# Patient Record
Sex: Male | Born: 1970 | Race: Black or African American | Hispanic: No | Marital: Married | Smoking: Never smoker
Health system: Southern US, Community
[De-identification: ages and names within clinical notes are randomized; demographics above are authoritative.]

## PROBLEM LIST (undated history)

## (undated) DIAGNOSIS — Z789 Other specified health status: Secondary | ICD-10-CM

## (undated) HISTORY — PX: NO PAST SURGERIES: SHX2092

---

## 1998-03-02 ENCOUNTER — Encounter: Admission: RE | Admit: 1998-03-02 | Discharge: 1998-03-02 | Payer: Self-pay | Admitting: Sports Medicine

## 1999-03-05 ENCOUNTER — Encounter: Payer: Self-pay | Admitting: Emergency Medicine

## 1999-03-05 ENCOUNTER — Emergency Department (HOSPITAL_COMMUNITY): Admission: EM | Admit: 1999-03-05 | Discharge: 1999-03-05 | Payer: Self-pay | Admitting: Emergency Medicine

## 2004-11-01 ENCOUNTER — Ambulatory Visit: Payer: Self-pay | Admitting: Internal Medicine

## 2007-06-03 DIAGNOSIS — J309 Allergic rhinitis, unspecified: Secondary | ICD-10-CM | POA: Insufficient documentation

## 2007-06-03 DIAGNOSIS — R519 Headache, unspecified: Secondary | ICD-10-CM | POA: Insufficient documentation

## 2007-06-03 DIAGNOSIS — I10 Essential (primary) hypertension: Secondary | ICD-10-CM | POA: Insufficient documentation

## 2007-06-03 DIAGNOSIS — R51 Headache: Secondary | ICD-10-CM

## 2012-03-02 ENCOUNTER — Encounter (HOSPITAL_COMMUNITY): Payer: Self-pay | Admitting: Family Medicine

## 2012-03-02 ENCOUNTER — Emergency Department (HOSPITAL_COMMUNITY)
Admission: EM | Admit: 2012-03-02 | Discharge: 2012-03-03 | Disposition: A | Discharge status: Home / Self Care | Payer: Self-pay | Attending: Emergency Medicine | Admitting: Emergency Medicine

## 2012-03-02 ENCOUNTER — Emergency Department (HOSPITAL_COMMUNITY): Payer: Self-pay

## 2012-03-02 DIAGNOSIS — R509 Fever, unspecified: Secondary | ICD-10-CM | POA: Insufficient documentation

## 2012-03-02 DIAGNOSIS — H6120 Impacted cerumen, unspecified ear: Secondary | ICD-10-CM | POA: Insufficient documentation

## 2012-03-02 DIAGNOSIS — R51 Headache: Secondary | ICD-10-CM | POA: Insufficient documentation

## 2012-03-02 NOTE — ED Notes (Signed)
Patient states he started having a fever, headache and chills since yesterday. Took Aleve at 1300, Nyquil Cold & Flu at 1900.
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
## 2012-03-03 MED ORDER — ALBUTEROL SULFATE HFA 108 (90 BASE) MCG/ACT IN AERS
2.0000 | INHALATION_SPRAY | RESPIRATORY_TRACT | Status: DC | PRN
  Administered 2012-03-03: 2 via RESPIRATORY_TRACT
  Filled 2012-03-03: qty 6.7

## 2012-03-03 NOTE — ED Provider Notes (Signed)
Medical screening examination/treatment/procedure(s) were performed by non-physician practitioner and as supervising physician I was immediately available for consultation/collaboration.  Awa Bachicha T Paris Hohn, MD 03/03/12 1623 

## 2012-03-03 NOTE — ED Notes (Signed)
Pt discharged.  No distress noted.

## 2012-03-03 NOTE — Discharge Instructions (Signed)
Please use albuterol inhaler 2 puff every 4-6 hrs as needed for shortness of breath.  Continue with your over the counter medication for fever and pain.  Drink plenty of fluid.  Return if your fever or headache worsen, or if you have other concerns.    Headaches, Frequently Asked Questions MIGRAINE HEADACHES Q: What is migraine? What causes it? How can I treat it? A: Generally, migraine headaches begin as a dull ache. Then they develop into a constant, throbbing, and pulsating pain. You may experience pain at the temples. You may experience pain at the front or back of one or both sides of the head. The pain is usually accompanied by a combination of:  Nausea.   Vomiting.   Sensitivity to light and noise.  Some people (about 15%) experience an aura (see below) before an attack. The cause of migraine is believed to be chemical reactions in the brain. Treatment for migraine may include over-the-counter or prescription medications. It may also include self-help techniques. These include relaxation training and biofeedback.  Q: What is an aura? A: About 15% of people with migraine get an "aura". This is a sign of neurological symptoms that occur before a migraine headache. You may see wavy or jagged lines, dots, or flashing lights. You might experience tunnel vision or blind spots in one or both eyes. The aura can include visual or auditory hallucinations (something imagined). It may include disruptions in smell (such as strange odors), taste or touch. Other symptoms include:  Numbness.   A "pins and needles" sensation.   Difficulty in recalling or speaking the correct word.  These neurological events may last as long as 60 minutes. These symptoms will fade as the headache begins. Q: What is a trigger? A: Certain physical or environmental factors can lead to or "trigger" a migraine. These include:  Foods.   Hormonal changes.   Weather.   Stress.  It is important to remember that triggers  are different for everyone. To help prevent migraine attacks, you need to figure out which triggers affect you. Keep a headache diary. This is a good way to track triggers. The diary will help you talk to your healthcare professional about your condition. Q: Does weather affect migraines? A: Bright sunshine, hot, humid conditions, and drastic changes in barometric pressure may lead to, or "trigger," a migraine attack in some people. But studies have shown that weather does not act as a trigger for everyone with migraines. Q: What is the link between migraine and hormones? A: Hormones start and regulate many of your body's functions. Hormones keep your body in balance within a constantly changing environment. The levels of hormones in your body are unbalanced at times. Examples are during menstruation, pregnancy, or menopause. That can lead to a migraine attack. In fact, about three quarters of all women with migraine report that their attacks are related to the menstrual cycle.  Q: Is there an increased risk of stroke for migraine sufferers? A: The likelihood of a migraine attack causing a stroke is very remote. That is not to say that migraine sufferers cannot have a stroke associated with their migraines. In persons under age 63, the most common associated factor for stroke is migraine headache. But over the course of a person's normal life span, the occurrence of migraine headache may actually be associated with a reduced risk of dying from cerebrovascular disease due to stroke.  Q: What are acute medications for migraine? A: Acute medications are used to treat  the pain of the headache after it has started. Examples over-the-counter medications, NSAIDs, ergots, and triptans.  Q: What are the triptans? A: Triptans are the newest class of abortive medications. They are specifically targeted to treat migraine. Triptans are vasoconstrictors. They moderate some chemical reactions in the brain. The triptans  work on receptors in your brain. Triptans help to restore the balance of a neurotransmitter called serotonin. Fluctuations in levels of serotonin are thought to be a main cause of migraine.  Q: Are over-the-counter medications for migraine effective? A: Over-the-counter, or "OTC," medications may be effective in relieving mild to moderate pain and associated symptoms of migraine. But you should see your caregiver before beginning any treatment regimen for migraine.  Q: What are preventive medications for migraine? A: Preventive medications for migraine are sometimes referred to as "prophylactic" treatments. They are used to reduce the frequency, severity, and length of migraine attacks. Examples of preventive medications include antiepileptic medications, antidepressants, beta-blockers, calcium channel blockers, and NSAIDs (nonsteroidal anti-inflammatory drugs). Q: Why are anticonvulsants used to treat migraine? A: During the past few years, there has been an increased interest in antiepileptic drugs for the prevention of migraine. They are sometimes referred to as "anticonvulsants". Both epilepsy and migraine may be caused by similar reactions in the brain.  Q: Why are antidepressants used to treat migraine? A: Antidepressants are typically used to treat people with depression. They may reduce migraine frequency by regulating chemical levels, such as serotonin, in the brain.  Q: What alternative therapies are used to treat migraine? A: The term "alternative therapies" is often used to describe treatments considered outside the scope of conventional Western medicine. Examples of alternative therapy include acupuncture, acupressure, and yoga. Another common alternative treatment is herbal therapy. Some herbs are believed to relieve headache pain. Always discuss alternative therapies with your caregiver before proceeding. Some herbal products contain arsenic and other toxins. TENSION HEADACHES Q: What is a  tension-type headache? What causes it? How can I treat it? A: Tension-type headaches occur randomly. They are often the result of temporary stress, anxiety, fatigue, or anger. Symptoms include soreness in your temples, a tightening band-like sensation around your head (a "vice-like" ache). Symptoms can also include a pulling feeling, pressure sensations, and contracting head and neck muscles. The headache begins in your forehead, temples, or the back of your head and neck. Treatment for tension-type headache may include over-the-counter or prescription medications. Treatment may also include self-help techniques such as relaxation training and biofeedback. CLUSTER HEADACHES Q: What is a cluster headache? What causes it? How can I treat it? A: Cluster headache gets its name because the attacks come in groups. The pain arrives with little, if any, warning. It is usually on one side of the head. A tearing or bloodshot eye and a runny nose on the same side of the headache may also accompany the pain. Cluster headaches are believed to be caused by chemical reactions in the brain. They have been described as the most severe and intense of any headache type. Treatment for cluster headache includes prescription medication and oxygen. SINUS HEADACHES Q: What is a sinus headache? What causes it? How can I treat it? A: When a cavity in the bones of the face and skull (a sinus) becomes inflamed, the inflammation will cause localized pain. This condition is usually the result of an allergic reaction, a tumor, or an infection. If your headache is caused by a sinus blockage, such as an infection, you will probably have a  fever. An x-ray will confirm a sinus blockage. Your caregiver's treatment might include antibiotics for the infection, as well as antihistamines or decongestants.  REBOUND HEADACHES Q: What is a rebound headache? What causes it? How can I treat it? A: A pattern of taking acute headache medications too  often can lead to a condition known as "rebound headache." A pattern of taking too much headache medication includes taking it more than 2 days per week or in excessive amounts. That means more than the label or a caregiver advises. With rebound headaches, your medications not only stop relieving pain, they actually begin to cause headaches. Doctors treat rebound headache by tapering the medication that is being overused. Sometimes your caregiver will gradually substitute a different type of treatment or medication. Stopping may be a challenge. Regularly overusing a medication increases the potential for serious side effects. Consult a caregiver if you regularly use headache medications more than 2 days per week or more than the label advises. ADDITIONAL QUESTIONS AND ANSWERS Q: What is biofeedback? A: Biofeedback is a self-help treatment. Biofeedback uses special equipment to monitor your body's involuntary physical responses. Biofeedback monitors:  Breathing.   Pulse.   Heart rate.   Temperature.   Muscle tension.   Brain activity.  Biofeedback helps you refine and perfect your relaxation exercises. You learn to control the physical responses that are related to stress. Once the technique has been mastered, you do not need the equipment any more. Q: Are headaches hereditary? A: Four out of five (80%) of people that suffer report a family history of migraine. Scientists are not sure if this is genetic or a family predisposition. Despite the uncertainty, a child has a 50% chance of having migraine if one parent suffers. The child has a 75% chance if both parents suffer.  Q: Can children get headaches? A: By the time they reach high school, most young people have experienced some type of headache. Many safe and effective approaches or medications can prevent a headache from occurring or stop it after it has begun.  Q: What type of doctor should I see to diagnose and treat my headache? A: Start  with your primary caregiver. Discuss his or her experience and approach to headaches. Discuss methods of classification, diagnosis, and treatment. Your caregiver may decide to recommend you to a headache specialist, depending upon your symptoms or other physical conditions. Having diabetes, allergies, etc., may require a more comprehensive and inclusive approach to your headache. The National Head[employer] Foundation will provide, upon request, a list of [facility] physician members in your state. Document Released: 01/10/2004 Document Revised: 10/09/2011 Document Reviewed: 06/19/2008 [facility] Patient Information 2012 [place], [place].

## 2012-03-03 NOTE — ED Notes (Signed)
Pt. Became pale and fainted during blood draw for carboxyhemoglobin.  Assisted pt. Into chair and reclined it into trendelenburg.  Pt. Immediately regained color and felt better.  VSS.  Family at bedside.  PA at bedside to assess.

## 2012-03-03 NOTE — ED Notes (Signed)
Pt awake and alert with no complaints voiced

## 2012-11-16 ENCOUNTER — Emergency Department (HOSPITAL_COMMUNITY)
Admission: EM | Admit: 2012-11-16 | Discharge: 2012-11-16 | Disposition: A | Discharge status: Home / Self Care | Payer: Self-pay | Attending: Emergency Medicine | Admitting: Emergency Medicine

## 2012-11-16 DIAGNOSIS — R509 Fever, unspecified: Secondary | ICD-10-CM | POA: Insufficient documentation

## 2012-11-16 DIAGNOSIS — H00019 Hordeolum externum unspecified eye, unspecified eyelid: Secondary | ICD-10-CM | POA: Insufficient documentation

## 2012-11-16 MED ORDER — ERYTHROMYCIN 5 MG/GM OP OINT: TOPICAL_OINTMENT | OPHTHALMIC | Status: DC

## 2012-11-16 MED ORDER — ERYTHROMYCIN 5 MG/GM OP OINT
TOPICAL_OINTMENT | Freq: Once | OPHTHALMIC | Status: AC
  Administered 2012-11-16: 19:00:00 via OPHTHALMIC
  Filled 2012-11-16: qty 3.5

## 2012-11-16 NOTE — ED Provider Notes (Signed)
History  Scribed for Billy Gourd, PA-C/ Flint Melter, MD, the patient was seen in room WTR5/WTR5. This chart was scribed by Candelaria Stagers. The patient's care started at 6:48 PM    CSN: [id number]  Arrival date & time 11/16/12  1639   First MD Initiated Contact with Patient 11/16/12 1827      Chief Complaint  Patient presents with  . Eye Problem     The history is provided by the patient. No language interpreter was used.   Billy Colon is a 42 y.o. male who presents to the Emergency Department complaining of right eye intermittent swelling and scratchiness that started about three months ago.  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  He has applied warm compresses with some relief.  Pt has experienced fever, drainage from right eye, and occasional blurred vision.  He denies getting anything in the eye.  He denies photophobia.  Pt reports the sty began after he started taking cold showers due to issues with having hot water at his house.      No past medical history on file.  No past surgical history on file.  No family history on file.  History  Substance Use Topics  . Smoking status: Not on file  . Smokeless tobacco: Not on file  . Alcohol Use: Yes     Comment: Occasional      Review of Systems  Constitutional: Positive for fever. Negative for chills.  Eyes: Positive for discharge and visual disturbance (intermittent blurred vision). Negative for photophobia.       Right eye swelling.   Respiratory: Negative for shortness of breath.   Gastrointestinal: Negative for nausea and vomiting.  Neurological: Negative for weakness.  All other systems reviewed and are negative.    Allergies  Review of patient's allergies indicates no known allergies.  Home Medications  No current outpatient prescriptions on file.  BP 136/85  Pulse 64  Temp 98.4 F (36.9 C) (Oral)  Resp 15  SpO2 100%  Physical Exam  Nursing note and vitals  reviewed. Constitutional: He is oriented to person, place, and time. He appears well-developed and well-nourished. No distress.  HENT:  Head: Normocephalic and atraumatic.  Eyes: Conjunctivae normal and EOM are normal.       External hordeolum with erythema on the right eyelid.  No foreign bodies. Mild tenderness to palpation.    Neck: Neck supple. No tracheal deviation present.  Cardiovascular: Normal rate and regular rhythm.   Pulmonary/Chest: Effort normal and breath sounds normal. No respiratory distress. He has no wheezes. He has no rales.  Musculoskeletal: Normal range of motion.  Neurological: He is alert and oriented to person, place, and time.  Skin: Skin is warm and dry.  Psychiatric: He has a normal mood and affect. His behavior is normal.    ED Course  Procedures   DIAGNOSTIC STUDIES: Oxygen Saturation is 100% on room air, normal by my interpretation.    COORDINATION OF CARE:     Labs Reviewed - No data to display No results found.   1. External hordeolum       MDM  42 y/o male with external hordeolum. No improvement despite warm compresses. Rx erythromycin ointment. Advised him to continue warm compresses. He will f/u with optho if no improvement in 1 week. Return precautions discussed. Patient states understanding of plan and is agreeable.  I personally performed the services described in this documentation, which was scribed in my presence.  The recorded information has been reviewed and is accurate.         Trevor Mace, PA-C 11/17/12 0051  Trevor Mace, PA-C 11/17/12 [phone]

## 2012-11-16 NOTE — ED Notes (Signed)
Pt states he has had some swelling around his R eye for the past 3 months. Pt concerned it may be a sty. Pt states eye is irritated and scratchy and worse at night.

## 2012-11-17 NOTE — ED Provider Notes (Signed)
Medical screening examination/treatment/procedure(s) were performed by non-physician practitioner and as supervising physician I was immediately available for consultation/collaboration.   Molleigh Huot L Elmus Mathes, MD 11/17/12 2321 

## 2014-08-02 ENCOUNTER — Emergency Department (HOSPITAL_COMMUNITY): Payer: Self-pay

## 2014-08-02 ENCOUNTER — Inpatient Hospital Stay (HOSPITAL_COMMUNITY)
Admission: EM | Admit: 2014-08-02 | Discharge: 2014-08-07 | DRG: 373 | Disposition: A | Discharge status: Home / Self Care | Payer: Self-pay | Attending: General Surgery | Admitting: General Surgery

## 2014-08-02 ENCOUNTER — Encounter (HOSPITAL_COMMUNITY): Payer: Self-pay | Admitting: Emergency Medicine

## 2014-08-02 DIAGNOSIS — K352 Acute appendicitis with generalized peritonitis: Principal | ICD-10-CM | POA: Diagnosis present

## 2014-08-02 DIAGNOSIS — K37 Unspecified appendicitis: Secondary | ICD-10-CM | POA: Diagnosis present

## 2014-08-02 DIAGNOSIS — K3532 Acute appendicitis with perforation and localized peritonitis, without abscess: Secondary | ICD-10-CM | POA: Diagnosis present

## 2014-08-02 DIAGNOSIS — Z9109 Other allergy status, other than to drugs and biological substances: Secondary | ICD-10-CM

## 2014-08-02 DIAGNOSIS — K3580 Unspecified acute appendicitis: Secondary | ICD-10-CM

## 2014-08-02 HISTORY — DX: Other specified health status: Z78.9

## 2014-08-02 LAB — CBC WITH DIFFERENTIAL/PLATELET
Basophils Absolute: 0 10*3/uL (ref 0.0–0.1)
Basophils Relative: 0 % (ref 0–1)
Eosinophils Absolute: 0.1 10*3/uL (ref 0.0–0.7)
Eosinophils Relative: 1 % (ref 0–5)
HEMATOCRIT: 42.9 % (ref 39.0–52.0)
HEMOGLOBIN: 14.4 g/dL (ref 13.0–17.0)
LYMPHS PCT: 15 % (ref 12–46)
Lymphs Abs: 1.5 10*3/uL (ref 0.7–4.0)
MCH: 28.1 pg (ref 26.0–34.0)
MCHC: 33.6 g/dL (ref 30.0–36.0)
MCV: 83.6 fL (ref 78.0–100.0)
MONO ABS: 0.9 10*3/uL (ref 0.1–1.0)
MONOS PCT: 9 % (ref 3–12)
NEUTROS ABS: 7.8 10*3/uL — AB (ref 1.7–7.7)
NEUTROS PCT: 75 % (ref 43–77)
Platelets: 225 10*3/uL (ref 150–400)
RBC: 5.13 MIL/uL (ref 4.22–5.81)
RDW: 14.5 % (ref 11.5–15.5)
WBC: 10.3 10*3/uL (ref 4.0–10.5)

## 2014-08-02 LAB — COMPREHENSIVE METABOLIC PANEL
ALK PHOS: 125 U/L — AB (ref 39–117)
ALT: 25 U/L (ref 0–53)
ANION GAP: 11 (ref 5–15)
AST: 22 U/L (ref 0–37)
Albumin: 3.5 g/dL (ref 3.5–5.2)
BILIRUBIN TOTAL: 1 mg/dL (ref 0.3–1.2)
BUN: 15 mg/dL (ref 6–23)
CHLORIDE: 99 meq/L (ref 96–112)
CO2: 27 meq/L (ref 19–32)
CREATININE: 0.91 mg/dL (ref 0.50–1.35)
Calcium: 9 mg/dL (ref 8.4–10.5)
GFR calc Af Amer: >90 mL/min (ref >90)
Glucose, Bld: 103 mg/dL — ABNORMAL HIGH (ref 70–99)
POTASSIUM: 4.4 meq/L (ref 3.7–5.3)
Sodium: 137 mEq/L (ref 137–147)
Total Protein: 7.9 g/dL (ref 6.0–8.3)

## 2014-08-02 LAB — URINALYSIS, ROUTINE W REFLEX MICROSCOPIC
BILIRUBIN URINE: NEGATIVE
GLUCOSE, UA: NEGATIVE mg/dL
Hgb urine dipstick: NEGATIVE
KETONES UR: NEGATIVE mg/dL
LEUKOCYTES UA: NEGATIVE
Nitrite: NEGATIVE
PH: 6 (ref 5.0–8.0)
PROTEIN: 30 mg/dL — AB
Specific Gravity, Urine: 1.036 — ABNORMAL HIGH (ref 1.005–1.030)
Urobilinogen, UA: 1 mg/dL (ref 0.0–1.0)

## 2014-08-02 LAB — LIPASE, BLOOD: Lipase: 29 U/L (ref 11–59)

## 2014-08-02 LAB — URINE MICROSCOPIC-ADD ON: Urine-Other: NONE SEEN

## 2014-08-02 MED ORDER — ACETAMINOPHEN 325 MG PO TABS: 650.0000 mg | ORAL_TABLET | Freq: Four times a day (QID) | ORAL | Status: DC | PRN

## 2014-08-02 MED ORDER — IOHEXOL 300 MG/ML  SOLN
50.0000 mL | Freq: Once | INTRAMUSCULAR | Status: AC | PRN
  Administered 2014-08-02: 50 mL via ORAL

## 2014-08-02 MED ORDER — DEXTROSE 5 % IV SOLN
1.0000 g | Freq: Four times a day (QID) | INTRAVENOUS | Status: DC
  Administered 2014-08-02 – 2014-08-07 (×18): 1 g via INTRAVENOUS
  Filled 2014-08-02 (×19): qty 1

## 2014-08-02 MED ORDER — ENOXAPARIN SODIUM 40 MG/0.4ML ~~LOC~~ SOLN
40.0000 mg | Freq: Every day | SUBCUTANEOUS | Status: DC
  Administered 2014-08-02 – 2014-08-06 (×5): 40 mg via SUBCUTANEOUS
  Filled 2014-08-02 (×6): qty 0.4

## 2014-08-02 MED ORDER — KETOROLAC TROMETHAMINE 30 MG/ML IJ SOLN
30.0000 mg | Freq: Once | INTRAMUSCULAR | Status: AC
  Administered 2014-08-02: 30 mg via INTRAVENOUS
  Filled 2014-08-02: qty 1

## 2014-08-02 MED ORDER — HYDROCODONE-ACETAMINOPHEN 5-325 MG PO TABS
1.0000 | ORAL_TABLET | ORAL | Status: DC | PRN
  Administered 2014-08-03 (×2): 2 via ORAL
  Administered 2014-08-03: 1 via ORAL
  Administered 2014-08-03: 2 via ORAL
  Filled 2014-08-02: qty 1
  Filled 2014-08-02 (×3): qty 2

## 2014-08-02 MED ORDER — DOCUSATE SODIUM 100 MG PO CAPS
100.0000 mg | ORAL_CAPSULE | Freq: Two times a day (BID) | ORAL | Status: DC
  Administered 2014-08-02 – 2014-08-06 (×6): 100 mg via ORAL
  Filled 2014-08-02 (×11): qty 1

## 2014-08-02 MED ORDER — ACETAMINOPHEN 650 MG RE SUPP: 650.0000 mg | Freq: Four times a day (QID) | RECTAL | Status: DC | PRN

## 2014-08-02 MED ORDER — SODIUM CHLORIDE 0.9 % IV BOLUS (SEPSIS)
1000.0000 mL | INTRAVENOUS | Status: AC
  Administered 2014-08-02: 1000 mL via INTRAVENOUS

## 2014-08-02 MED ORDER — DIPHENHYDRAMINE HCL 12.5 MG/5ML PO ELIX: 12.5000 mg | ORAL_SOLUTION | Freq: Four times a day (QID) | ORAL | Status: DC | PRN

## 2014-08-02 MED ORDER — ONDANSETRON HCL 4 MG/2ML IJ SOLN
4.0000 mg | Freq: Four times a day (QID) | INTRAMUSCULAR | Status: DC | PRN
Start: 2014-08-02 — End: 2014-08-07
  Administered 2014-08-04: 4 mg via INTRAVENOUS
  Filled 2014-08-02: qty 2

## 2014-08-02 MED ORDER — KCL IN DEXTROSE-NACL 20-5-0.45 MEQ/L-%-% IV SOLN
INTRAVENOUS | Status: DC
  Administered 2014-08-03: 18:00:00 via INTRAVENOUS
  Administered 2014-08-03: 75 mL via INTRAVENOUS
  Administered 2014-08-04: 22:00:00 via INTRAVENOUS
  Administered 2014-08-04: 75 mL via INTRAVENOUS
  Administered 2014-08-05: 75 mL/h via INTRAVENOUS
  Administered 2014-08-06 – 2014-08-07 (×2): via INTRAVENOUS
  Filled 2014-08-02 (×8): qty 1000

## 2014-08-02 MED ORDER — IOHEXOL 300 MG/ML  SOLN
100.0000 mL | Freq: Once | INTRAMUSCULAR | Status: AC | PRN
  Administered 2014-08-02: 100 mL via INTRAVENOUS

## 2014-08-02 MED ORDER — DIPHENHYDRAMINE HCL 50 MG/ML IJ SOLN: 12.5000 mg | Freq: Four times a day (QID) | INTRAMUSCULAR | Status: DC | PRN

## 2014-08-02 NOTE — ED Provider Notes (Signed)
CSN: [id number]     Arrival date & time 08/02/14  1338 History   First MD Initiated Contact with Patient 08/02/14 1504     Chief Complaint  Patient presents with  . Abdominal Pain     (Consider location/radiation/quality/duration/timing/severity/associated sxs/prior Treatment) The history is provided by the patient and medical records. No language interpreter was used.    Billy Colon is a 43 y.o. male  with no known medical presents to the Emergency Department complaining of gradual, persistent, nagging generalized and lower abd pain with associated urnary frequency onset 5 days ago.  Associated symptoms include subjective fever for the first day which ha not reoccured.  Aleve makes it better and nothing makes it worse.  Pt denies chills, headache, neck pain,  chest pain,  SOB, N/V/D, weakness, dizziness, syncope, dysuria, hematuria, polyuria, polydipsia.       Past Medical History  Diagnosis Date  . Medical history non-contributory    Past Surgical History  Procedure Laterality Date  . No past surgeries     History reviewed. No pertinent family history. History  Substance Use Topics  . Smoking status: Never Smoker   . Smokeless tobacco: Never Used  . Alcohol Use: Yes     Comment: Occasional    Review of Systems  Constitutional: Negative for fever, diaphoresis, appetite change, fatigue and unexpected weight change.  HENT: Negative for mouth sores.   Eyes: Negative for visual disturbance.  Respiratory: Negative for cough, chest tightness, shortness of breath and wheezing.   Cardiovascular: Negative for chest pain.  Gastrointestinal: Positive for abdominal pain. Negative for nausea, vomiting, diarrhea and constipation.  Endocrine: Negative for polydipsia, polyphagia and polyuria.  Genitourinary: Negative for dysuria, urgency, frequency and hematuria.  Musculoskeletal: Negative for back pain and neck stiffness.  Skin: Negative for rash.  Allergic/Immunologic: Negative for  immunocompromised state.  Neurological: Negative for syncope, light-headedness and headaches.  Hematological: Does not bruise/bleed easily.  Psychiatric/Behavioral: Negative for sleep disturbance. The patient is not nervous/anxious.       Allergies  Pollen extract  Home Medications   Prior to Admission medications   Medication Sig Start Date End Date Taking? Authorizing Provider  naproxen sodium (ANAPROX) 220 MG tablet Take 220 mg by mouth 2 (two) times daily with a meal.   Yes Historical Provider, MD   BP 114/67  Pulse 62  Temp(Src) 98.4 F (36.9 C) (Oral)  Resp 16  SpO2 100% Physical Exam  Nursing note and vitals reviewed. Constitutional: He appears well-developed and well-nourished.  HENT:  Head: Normocephalic and atraumatic.  Mouth/Throat: Oropharynx is clear and moist.  Eyes: Conjunctivae are normal. No scleral icterus.  Cardiovascular: Normal rate, regular rhythm, normal heart sounds and intact distal pulses.   No murmur heard. Pulmonary/Chest: Effort normal and breath sounds normal. No respiratory distress. He has no wheezes.  Abdominal: Soft. Bowel sounds are normal. He exhibits no distension and no mass. There is tenderness in the right lower quadrant, suprapubic area and left lower quadrant. There is guarding. There is no rebound and no CVA tenderness.  Lower abd tenderness with guarding, but no rebound or peritoneal signs No CVA tenderness  Neurological: He is alert. He exhibits normal muscle tone. Coordination normal.  Skin: Skin is warm and dry. No erythema.  Psychiatric: He has a normal mood and affect.    ED Course  Procedures (including critical care time) Labs Review Labs Reviewed  CBC WITH DIFFERENTIAL - Abnormal; Notable for the following:    Neutro Abs 7.8 (*)  All other components within normal limits  COMPREHENSIVE METABOLIC PANEL - Abnormal; Notable for the following:    Glucose, Bld 103 (*)    Alkaline Phosphatase 125 (*)    All other  components within normal limits  URINALYSIS, ROUTINE W REFLEX MICROSCOPIC - Abnormal; Notable for the following:    Color, Urine AMBER (*)    Specific Gravity, Urine 1.036 (*)    Protein, ur 30 (*)    All other components within normal limits  LIPASE, BLOOD  URINE MICROSCOPIC-ADD ON    Imaging Review Ct Abdomen Pelvis W Contrast  08/02/2014   CLINICAL DATA:  Low abdominal pain  EXAM: CT ABDOMEN AND PELVIS WITH CONTRAST  TECHNIQUE: Multidetector CT imaging of the abdomen and pelvis was performed using the standard protocol following bolus administration of intravenous contrast.  CONTRAST:  50mL OMNIPAQUE IOHEXOL 300 MG/ML SOLN, OMNIPAQUE IOHEXOL 300 MG/ML SOLN  COMPARISON:  None.  FINDINGS: The lung bases are free of acute infiltrate or sizable effusion. No parenchymal nodules are seen.  The liver, gallbladder, spleen, adrenal glands and pancreas are all normal in their CT appearance. The kidneys demonstrate a normal enhancement pattern bilaterally. No renal calculi or obstructive changes are seen.  The appendix is well visualized and appears within normal limits centrally at its junction with the ascending colon. More peripherally there are calcifications within and the appendix terminates in the region of a multiloculated fluid collection which measures approximately 4.4 x 3.7 cm. Although this may simply represent an abnormally dilated distal appendix, the possibility of localized rupture of appendicitis with a small multiloculated abscess would deserve consideration. This is not amenable to percutaneous drainage as no clear window is noted.  The bladder is well distended. No free fluid is noted within the pelvis. The osseous structures show no acute abnormality.  IMPRESSION: Changes consistent with appendicitis either within abnormally dilated appendiceal tip or a localized rupture with contained multiloculated abscess. It is not amenable to percutaneous intervention due to its position deep  within this pelvis overlying the lumbosacral junction. Surgical consultation is recommended.   Electronically Signed   By: Alcide Clever M.D.   On: 08/02/2014 19:24     EKG Interpretation None      MDM   Final diagnoses:  Acute appendicitis, unspecified acute appendicitis type   Billy Colon presents with lower abd pain and pain with urination.  Will obtain labs and reassess.    5:00PM Pt with increasing abd pain and tenderness on exam; will obtain CT scan.  No leukocytosis, pt is afebrile without tachycardia.  Pt declines further pain control and remains NPO.   No evidence of UTI.   7:53 PM Pt with Changes consistent with appendicitis either within abnormally dilated appendiceal tip or a localized rupture with contained multiloculated abscess  8:39 PM Discussed with Dr. Maisie Fus who will evaluate and admit.    BP 114/67  Pulse 62  Temp(Src) 98.4 F (36.9 C) (Oral)  Resp 16  SpO2 100%   Dierdre Forth, PA-C 08/02/14 2040

## 2014-08-02 NOTE — ED Provider Notes (Signed)
Medical screening examination/treatment/procedure(s) were conducted as a shared visit with non-physician practitioner(s) and myself.  I personally evaluated the patient during the encounter.   EKG Interpretation None     Patient here with right lower quadrant pain. He abdominal exam is positive for right lower quadrant tenderness without peritoneal signs. Abdominal CT positive for appendicitis and surgery to be counseled  Billy BakerAnthony T Kharson Rasmusson, MD 08/02/14 [phone]

## 2014-08-02 NOTE — Progress Notes (Signed)
  CARE MANAGEMENT ED NOTE 08/02/2014  Patient:  Billy Colon,Billy Colon   Account Number:  [id number]  Date Initiated:  08/02/2014  Documentation initiated by:  Radford PaxFERRERO,Maison Agrusa  Subjective/Objective Assessment:   Patient presents to Ed with lower abdominal pain and urinary frequency     Subjective/Objective Assessment Detail:   CT of abdomen and pelvis: Changes consistent with appendicitis either within abnormally  dilated appendiceal tip or a localized rupture with contained  multiloculated abscess     Action/Plan:   Action/Plan Detail:   Anticipated DC Date:       Status Recommendation to Physician:   Result of Recommendation:    Other ED Services  Consult Working Plan    DC Planning Services  Other  PCP issues    Choice offered to / List presented to:            Status of service:  Completed, signed off  ED Comments:   ED Comments Detail:  EDCM spoke to patient and his wife at bedside.  Patient confirms he [place] a [place]cp [place]  [facility], informed patient of services there and walk in times. EDCM also provided patient with list of pcps who accept self pay patients, list of discounted pharmacies and websites needymeds.org and GoodRX.com for medication assistance, list of financial resources in the community such as local churches, salvation [employer], urban ministries, phonenumber to inqiure about the [employer] card, DSS for [employer] and phone number to inquire about [employer] Care Act for insurance, and dental assistance for uninsured patients.  Patient thankful for resources.  No further EDCM needs at this time.

## 2014-08-02 NOTE — ED Notes (Signed)
Pt c/o low abd pain w/ frequency.  Denies dysuria.  Denies NVD.

## 2014-08-02 NOTE — H&P (Signed)
Billy Colon is an 43 y.o. male.   Chief Complaint: abd pain HPI: Pt presents to the Emergency Department complaining of gradual, persistent, nagging generalized and lower abd pain with associated urnary frequency onset 5 days ago. Associated symptoms include subjective fever for the first day which ha not reoccured. His bowel habits have been regular.  Pt denies chills, headache, N/V/D.  Past Medical History  Diagnosis Date  . Medical history non-contributory     Past Surgical History  Procedure Laterality Date  . No past surgeries      History reviewed. No pertinent family history. Social History:  reports that he has never smoked. He has never used smokeless tobacco. He reports that he drinks alcohol. He reports that he does not use illicit drugs.  Allergies:  Allergies  Allergen Reactions  . Pollen Extract Other (See Comments)    Sneezing     (Not in a hospital admission)  Results for orders placed during the hospital encounter of 08/02/14 (from the past 48 hour(s))  URINALYSIS, ROUTINE W REFLEX MICROSCOPIC     Status: Abnormal   Collection Time    08/02/14  3:20 PM      Result Value Ref Range   Color, Urine AMBER (*) YELLOW   Comment: BIOCHEMICALS MAY BE AFFECTED BY COLOR   APPearance CLEAR  CLEAR   Specific Gravity, Urine 1.036 (*) 1.005 - 1.030   pH 6.0  5.0 - 8.0   Glucose, UA NEGATIVE  NEGATIVE mg/dL   Hgb urine dipstick NEGATIVE  NEGATIVE   Bilirubin Urine NEGATIVE  NEGATIVE   Ketones, ur NEGATIVE  NEGATIVE mg/dL   Protein, ur 30 (*) NEGATIVE mg/dL   Urobilinogen, UA 1.0  0.0 - 1.0 mg/dL   Nitrite NEGATIVE  NEGATIVE   Leukocytes, UA NEGATIVE  NEGATIVE  URINE MICROSCOPIC-ADD ON     Status: None   Collection Time    08/02/14  3:20 PM      Result Value Ref Range   Urine-Other       Value: NO FORMED ELEMENTS SEEN ON URINE MICROSCOPIC EXAMINATION  LIPASE, BLOOD     Status: None   Collection Time    08/02/14  3:51 PM      Result Value Ref Range   Lipase 29   11 - 59 U/L  CBC WITH DIFFERENTIAL     Status: Abnormal   Collection Time    08/02/14  3:52 PM      Result Value Ref Range   WBC 10.3  4.0 - 10.5 K/uL   RBC 5.13  4.22 - 5.81 MIL/uL   Hemoglobin 14.4  13.0 - 17.0 g/dL   HCT 42.9  39.0 - 52.0 %   MCV 83.6  78.0 - 100.0 fL   MCH 28.1  26.0 - 34.0 pg   MCHC 33.6  30.0 - 36.0 g/dL   RDW 14.5  11.5 - 15.5 %   Platelets 225  150 - 400 K/uL   Neutrophils Relative % 75  43 - 77 %   Neutro Abs 7.8 (*) 1.7 - 7.7 K/uL   Lymphocytes Relative 15  12 - 46 %   Lymphs Abs 1.5  0.7 - 4.0 K/uL   Monocytes Relative 9  3 - 12 %   Monocytes Absolute 0.9  0.1 - 1.0 K/uL   Eosinophils Relative 1  0 - 5 %   Eosinophils Absolute 0.1  0.0 - 0.7 K/uL   Basophils Relative 0  0 - 1 %  Basophils Absolute 0.0  0.0 - 0.1 K/uL  COMPREHENSIVE METABOLIC PANEL     Status: Abnormal   Collection Time    08/02/14  3:52 PM      Result Value Ref Range   Sodium 137  137 - 147 mEq/L   Potassium 4.4  3.7 - 5.3 mEq/L   Chloride 99  96 - 112 mEq/L   CO2 27  19 - 32 mEq/L   Glucose, Bld 103 (*) 70 - 99 mg/dL   BUN 15  6 - 23 mg/dL   Creatinine, Ser 0.91  0.50 - 1.35 mg/dL   Calcium 9.0  8.4 - 10.5 mg/dL   Total Protein 7.9  6.0 - 8.3 g/dL   Albumin 3.5  3.5 - 5.2 g/dL   AST 22  0 - 37 U/L   ALT 25  0 - 53 U/L   Alkaline Phosphatase 125 (*) 39 - 117 U/L   Total Bilirubin 1.0  0.3 - 1.2 mg/dL   GFR calc non Af Amer >90  >90 mL/min   GFR calc Af Amer >90  >90 mL/min   Comment: (NOTE)     The eGFR has been calculated using the CKD EPI equation.     This calculation has not been validated in all clinical situations.     eGFR's persistently <90 mL/min signify possible Chronic Kidney     Disease.   Anion gap 11  5 - 15   Ct Abdomen Pelvis W Contrast  08/02/2014   CLINICAL DATA:  Low abdominal pain  EXAM: CT ABDOMEN AND PELVIS WITH CONTRAST  TECHNIQUE: Multidetector CT imaging of the abdomen and pelvis was performed using the standard protocol following bolus  administration of intravenous contrast.  CONTRAST:  39m OMNIPAQUE IOHEXOL 300 MG/ML SOLN, 1049mOMNIPAQUE IOHEXOL 300 MG/ML SOLN  COMPARISON:  None.  FINDINGS: The lung bases are free of acute infiltrate or sizable effusion. No parenchymal nodules are seen.  The liver, gallbladder, spleen, adrenal glands and pancreas are all normal in their CT appearance. The kidneys demonstrate a normal enhancement pattern bilaterally. No renal calculi or obstructive changes are seen.  The appendix is well visualized and appears within normal limits centrally at its junction with the ascending colon. More peripherally there are calcifications within and the appendix terminates in the region of a multiloculated fluid collection which measures approximately 4.4 x 3.7 cm. Although this may simply represent an abnormally dilated distal appendix, the possibility of localized rupture of appendicitis with a small multiloculated abscess would deserve consideration. This is not amenable to percutaneous drainage as no clear window is noted.  The bladder is well distended. No free fluid is noted within the pelvis. The osseous structures show no acute abnormality.  IMPRESSION: Changes consistent with appendicitis either within abnormally dilated appendiceal tip or a localized rupture with contained multiloculated abscess. It is not amenable to percutaneous intervention due to its position deep within this pelvis overlying the lumbosacral junction. Surgical consultation is recommended.   Electronically Signed   By: MaInez Catalina.D.   On: 08/02/2014 19:24    Review of Systems  Constitutional: Positive for fever. Negative for chills and malaise/fatigue.  Respiratory: Negative for cough and sputum production.   Cardiovascular: Negative for chest pain.  Gastrointestinal: Positive for abdominal pain. Negative for nausea, vomiting, diarrhea and constipation.  Genitourinary: Positive for dysuria, urgency and frequency. Negative for hematuria  and flank pain.  Musculoskeletal: Negative for myalgias.  Skin: Negative for rash.  Neurological: Negative for  dizziness, weakness and headaches.    Blood pressure 114/67, pulse 62, temperature 98.4 F (36.9 C), temperature source Oral, resp. rate 16, SpO2 100.00%. Physical Exam  Constitutional: He is oriented to person, place, and time. He appears well-developed and well-nourished. No distress.  HENT:  Head: Normocephalic and atraumatic.  Eyes: Conjunctivae are normal. Pupils are equal, round, and reactive to light.  Neck: Normal range of motion. Neck supple.  Cardiovascular: Normal rate and regular rhythm.   Respiratory: Effort normal and breath sounds normal. No respiratory distress.  GI: Soft. He exhibits no distension. There is tenderness. There is guarding.  Musculoskeletal: Normal range of motion.  Neurological: He is alert and oriented to person, place, and time.  Skin: Skin is warm and dry. He is not diaphoretic.     Assessment/Plan Focally ruptured appendicitis.  Admit, NPO, IV antibiotics.  Seriel abd exams.  If exam improves on antibiotics, may be able to avoid surgery.    Sandar Krinke C. 2/87/6811, 5:72 PM

## 2014-08-02 NOTE — ED Provider Notes (Signed)
Medical screening examination/treatment/procedure(s) were conducted as a shared visit with non-physician practitioner(s) and myself.  I personally evaluated the patient during the encounter.   EKG Interpretation None       Toy BakerAnthony T Luxe Cuadros, MD 08/02/14 [phone]

## 2014-08-03 DIAGNOSIS — K3532 Acute appendicitis with perforation and localized peritonitis, without abscess: Secondary | ICD-10-CM | POA: Diagnosis present

## 2014-08-03 MED ORDER — LIP MEDEX EX OINT
TOPICAL_OINTMENT | CUTANEOUS | Status: DC | PRN
  Filled 2014-08-03 (×2): qty 7

## 2014-08-03 MED ORDER — MORPHINE SULFATE 2 MG/ML IJ SOLN
2.0000 mg | INTRAMUSCULAR | Status: DC | PRN
  Administered 2014-08-03 – 2014-08-06 (×9): 2 mg via INTRAVENOUS
  Filled 2014-08-03 (×9): qty 1

## 2014-08-03 NOTE — Progress Notes (Signed)
Patient ID: Billy Colon, male   DOB: Feb 28, 1971, 43 y.o.   MRN: [id number]     [facility]      [address] [place], [place] [address]    Phone: [phone] FAX: [phone]     Subjective: Reports his pain is better.  No diarrhea.  No n/v.  Afebrile.  VSS.    Objective:  Vital signs:  Filed Vitals:   08/02/14 1932 08/02/14 2303 08/03/14 0551 08/03/14 0700  BP: 114/67 123/67 116/63   Pulse: 62 64 68   Temp:  98.6 F (37 C) 99 F (37.2 C)   TempSrc:  Oral Oral   Resp: 16 16 16    Height:   6' 2"  (1.88 m)   Weight:    188 lb 0.8 oz (85.3 kg)  SpO2: 100% 100% 100%        Intake/Output   Yesterday:    This shift:     none recorded.  Reports adequate UOP.    Physical Exam: General: Pt awake/alert/oriented x4 in no acute distress Chest: cta.  No chest wall pain w good excursion CV:  Pulses intact.  Regular rhythm MS: Normal AROM mjr joints.  No obvious deformity Abdomen: Soft.  Nondistended. Moderate TTP rlq, llq without guarding.   No evidence of peritonitis.  No incarcerated hernias. Ext:  SCDs BLE.  No mjr edema.  No cyanosis Skin: No petechiae / purpura   Problem List:   Active Problems:   Appendicitis    Results:   Labs: Results for orders placed during the hospital encounter of 08/02/14 (from the past 48 hour(s))  URINALYSIS, ROUTINE W REFLEX MICROSCOPIC     Status: Abnormal   Collection Time    08/02/14  3:20 PM      Result Value Ref Range   Color, Urine AMBER (*) YELLOW   Comment: BIOCHEMICALS MAY BE AFFECTED BY COLOR   APPearance CLEAR  CLEAR   Specific Gravity, Urine 1.036 (*) 1.005 - 1.030   pH 6.0  5.0 - 8.0   Glucose, UA NEGATIVE  NEGATIVE mg/dL   Hgb urine dipstick NEGATIVE  NEGATIVE   Bilirubin Urine NEGATIVE  NEGATIVE   Ketones, ur NEGATIVE  NEGATIVE mg/dL   Protein, ur 30 (*) NEGATIVE mg/dL   Urobilinogen, UA 1.0  0.0 - 1.0 mg/dL   Nitrite NEGATIVE  NEGATIVE   Leukocytes, UA  NEGATIVE  NEGATIVE  URINE MICROSCOPIC-ADD ON     Status: None   Collection Time    08/02/14  3:20 PM      Result Value Ref Range   Urine-Other       Value: NO FORMED ELEMENTS SEEN ON URINE MICROSCOPIC EXAMINATION  LIPASE, BLOOD     Status: None   Collection Time    08/02/14  3:51 PM      Result Value Ref Range   Lipase 29  11 - 59 U/L  CBC WITH DIFFERENTIAL     Status: Abnormal   Collection Time    08/02/14  3:52 PM      Result Value Ref Range   WBC 10.3  4.0 - 10.5 K/uL   RBC 5.13  4.22 - 5.81 MIL/uL   Hemoglobin 14.4  13.0 - 17.0 g/dL   HCT 42.9  39.0 - 52.0 %   MCV 83.6  78.0 - 100.0 fL   MCH 28.1  26.0 - 34.0 pg   MCHC 33.6  30.0 - 36.0 g/dL   RDW 14.5  11.5 - 15.5 %   Platelets 225  150 - 400 K/uL   Neutrophils Relative % 75  43 - 77 %   Neutro Abs 7.8 (*) 1.7 - 7.7 K/uL   Lymphocytes Relative 15  12 - 46 %   Lymphs Abs 1.5  0.7 - 4.0 K/uL   Monocytes Relative 9  3 - 12 %   Monocytes Absolute 0.9  0.1 - 1.0 K/uL   Eosinophils Relative 1  0 - 5 %   Eosinophils Absolute 0.1  0.0 - 0.7 K/uL   Basophils Relative 0  0 - 1 %   Basophils Absolute 0.0  0.0 - 0.1 K/uL  COMPREHENSIVE METABOLIC PANEL     Status: Abnormal   Collection Time    08/02/14  3:52 PM      Result Value Ref Range   Sodium 137  137 - 147 mEq/L   Potassium 4.4  3.7 - 5.3 mEq/L   Chloride 99  96 - 112 mEq/L   CO2 27  19 - 32 mEq/L   Glucose, Bld 103 (*) 70 - 99 mg/dL   BUN 15  6 - 23 mg/dL   Creatinine, Ser 0.91  0.50 - 1.35 mg/dL   Calcium 9.0  8.4 - 10.5 mg/dL   Total Protein 7.9  6.0 - 8.3 g/dL   Albumin 3.5  3.5 - 5.2 g/dL   AST 22  0 - 37 U/L   ALT 25  0 - 53 U/L   Alkaline Phosphatase 125 (*) 39 - 117 U/L   Total Bilirubin 1.0  0.3 - 1.2 mg/dL   GFR calc non Af Amer >90  >90 mL/min   GFR calc Af Amer >90  >90 mL/min   Comment: (NOTE)     The eGFR has been calculated using the CKD EPI equation.     This calculation has not been validated in all clinical situations.     eGFR's persistently  <90 mL/min signify possible Chronic Kidney     Disease.   Anion gap 11  5 - 15    Imaging / Studies: Ct Abdomen Pelvis W Contrast  08/02/2014   CLINICAL DATA:  Low abdominal pain  EXAM: CT ABDOMEN AND PELVIS WITH CONTRAST  TECHNIQUE: Multidetector CT imaging of the abdomen and pelvis was performed using the standard protocol following bolus administration of intravenous contrast.  CONTRAST:  45m OMNIPAQUE IOHEXOL 300 MG/ML SOLN, 1038mOMNIPAQUE IOHEXOL 300 MG/ML SOLN  COMPARISON:  None.  FINDINGS: The lung bases are free of acute infiltrate or sizable effusion. No parenchymal nodules are seen.  The liver, gallbladder, spleen, adrenal glands and pancreas are all normal in their CT appearance. The kidneys demonstrate a normal enhancement pattern bilaterally. No renal calculi or obstructive changes are seen.  The appendix is well visualized and appears within normal limits centrally at its junction with the ascending colon. More peripherally there are calcifications within and the appendix terminates in the region of a multiloculated fluid collection which measures approximately 4.4 x 3.7 cm. Although this may simply represent an abnormally dilated distal appendix, the possibility of localized rupture of appendicitis with a small multiloculated abscess would deserve consideration. This is not amenable to percutaneous drainage as no clear window is noted.  The bladder is well distended. No free fluid is noted within the pelvis. The osseous structures show no acute abnormality.  IMPRESSION: Changes consistent with appendicitis either within abnormally dilated appendiceal tip or a localized rupture with contained multiloculated abscess. It is  not amenable to percutaneous intervention due to its position deep within this pelvis overlying the lumbosacral junction. Surgical consultation is recommended.   Electronically Signed   By: Inez Catalina M.D.   On: 08/02/2014 19:24    Medications / Allergies:  Scheduled  Meds: . cefOXitin  1 g Intravenous 4 times per day  . docusate sodium  100 mg Oral BID  . enoxaparin (LOVENOX) injection  40 mg Subcutaneous QHS   Continuous Infusions: . dextrose 5 % and 0.45 % NaCl with KCl 20 mEq/L 75 mL (08/03/14 0110)   PRN Meds:.acetaminophen, acetaminophen, diphenhydrAMINE, diphenhydrAMINE, HYDROcodone-acetaminophen, ondansetron  Antibiotics: Anti-infectives   Start     Dose/Rate Route Frequency Ordered Stop   08/03/14 0000  cefOXitin (MEFOXIN) 1 g in dextrose 5 % 50 mL IVPB     1 g 100 mL/hr over 30 Minutes Intravenous 4 times per day 08/02/14 2307          Assessment/Plan Focally ruptured appendicitis -pain has improved, afebrile, VSS.  We will continue with non surgical management today and re-evaluate things tomorrow morning -continue with Cefoxitin -SCD/lovenox -mobilize -?ok to give clears -IV hydration -repeat CBC in AM   Erby Pian, [facility] Pager [phone](7A-4:30P)   08/03/2014 9:35 AM

## 2014-08-03 NOTE — Progress Notes (Signed)
Patient and family would like to know when patient can eat, paged Emina NP regarding patient's request, awaiting callback Stanford BreedBracey, Maziyah Vessel N

## 2014-08-03 NOTE — Progress Notes (Signed)
INITIAL NUTRITION ASSESSMENT  DOCUMENTATION CODES Per approved criteria  -Not Applicable   INTERVENTION: - Diet advancement per MD - RD to continue to monitor   NUTRITION DIAGNOSIS: Inadequate oral intake related to inability as eat as evidenced by NPO.    Goal: Advance diet as tolerated to regular diet  Monitor:  Weights, labs, intake  Reason for Assessment: Malnutrition screening tool   43 y.o. male  Admitting Dx: Abdominal pain   ASSESSMENT: Admitted with generalized and lower abdominal pain x 5 days. Found to have focally ruptured appendicitis.   - Met with pt who reports great appetite at home, eating at least 3 meals/day of a well balanced diet - Drinks whey protein smoothies - Said he's lost 48 pounds in the past year due to lifestyle changes from working out 4-5 days a week and avoiding fried foods and sweets  - Denies any improvement in abdominal pain - Performed nutrition focused physical exam which was WNL   Alk phos elevated    Height: Ht Readings from Last 1 Encounters:  08/03/14 6' 2"  (1.88 m)    Weight: Wt Readings from Last 1 Encounters:  08/03/14 188 lb 0.8 oz (85.3 kg)    Ideal Body Weight: 190 lbs   % Ideal Body Weight: 99%  Wt Readings from Last 10 Encounters:  08/03/14 188 lb 0.8 oz (85.3 kg)  03/02/12 219 lb (99.338 kg)    Usual Body Weight: 236 lbs  % Usual Body Weight: 80%  BMI:  Body mass index is 24.13 kg/(m^2).  Estimated Nutritional Needs: Kcal: 2200-2400 Protein: 110-130g Fluid: 2.2-2.4L/day   Skin: intact   Diet Order: NPO  EDUCATION NEEDS: -No education needs identified at this time   Intake/Output Summary (Last 24 hours) at 08/03/14 1027 Last data filed at 08/03/14 1000  Gross per 24 hour  Intake      0 ml  Output    250 ml  Net   -250 ml    Last BM: 9/30  Labs:   Recent Labs Lab 08/02/14 1552  NA 137  K 4.4  CL 99  CO2 27  BUN 15  CREATININE 0.91  CALCIUM 9.0  GLUCOSE 103*    CBG  (last 3)  No results found for this basename: GLUCAP,  in the last 72 hours  Scheduled Meds: . cefOXitin  1 g Intravenous 4 times per day  . docusate sodium  100 mg Oral BID  . enoxaparin (LOVENOX) injection  40 mg Subcutaneous QHS    Continuous Infusions: . dextrose 5 % and 0.45 % NaCl with KCl 20 mEq/L 75 mL (08/03/14 0110)    Past Medical History  Diagnosis Date  . Medical history non-contributory     Past Surgical History  Procedure Laterality Date  . No past surgeries      Carlis Stable MS, RD, LDN [phone] Pager [phone] Weekend/After Hours Pager

## 2014-08-03 NOTE — Progress Notes (Signed)
Patient requested for Carmex, verbal ordered received from Dr. Andrey CampanileWilson to receive Carmex.  Order Entered for Carmex.   Estanislado EmmsAshley Schwarz, RN 08/03/2014. 4:43 PM.

## 2014-08-03 NOTE — Progress Notes (Signed)
Pt seen and examined.  abd pain improved compared to prior to admission. Still with some present - intermittent; mainly around umbilicus  Alert, nad, nontoxic, smiling and talking with family Soft, nd, not rigid, mild periumbilical TTP  Ct reviewed No fever, no wbc, no tachy, no peritoneal signs, abd pain improving so will continue with nonop mgmt of ruptured appendicitis with contained abscess Bowel rest today, IV abx  Discussed his ct and management of ruptured appendicitis with contained abscess with pt and family. Discussed pros and cons of surgery and non-op mgmt. If successful with non-op mgmt I believe pt will need interval appendectomy given calcification present in appendix. Advised pt that there is possibility he may not be successful with non-op mgmt during this admission (failure to improve, fever, tachycardia, wbc, worsening pain) would be indications to re-evaluate non-op treatment plan.   Pt wants to cont with nonop mgmt for now. I think this is reasonable given his current clinical condition.   Mary SellaEric M. Andrey CampanileWilson, MD, FACS General, Bariatric, & Minimally Invasive Surgery [facility] [place]

## 2014-08-03 NOTE — Progress Notes (Signed)
UR completed 

## 2014-08-04 LAB — CBC WITH DIFFERENTIAL/PLATELET
BASOS ABS: 0 10*3/uL (ref 0.0–0.1)
Basophils Relative: 0 % (ref 0–1)
EOS PCT: 1 % (ref 0–5)
Eosinophils Absolute: 0.1 10*3/uL (ref 0.0–0.7)
HCT: 41.4 % (ref 39.0–52.0)
Hemoglobin: 13.5 g/dL (ref 13.0–17.0)
Lymphocytes Relative: 15 % (ref 12–46)
Lymphs Abs: 1.5 10*3/uL (ref 0.7–4.0)
MCH: 27.4 pg (ref 26.0–34.0)
MCHC: 32.6 g/dL (ref 30.0–36.0)
MCV: 84 fL (ref 78.0–100.0)
Monocytes Absolute: 1 10*3/uL (ref 0.1–1.0)
Monocytes Relative: 10 % (ref 3–12)
Neutro Abs: 7.7 10*3/uL (ref 1.7–7.7)
Neutrophils Relative %: 74 % (ref 43–77)
PLATELETS: 213 10*3/uL (ref 150–400)
RBC: 4.93 MIL/uL (ref 4.22–5.81)
RDW: 14.3 % (ref 11.5–15.5)
WBC: 10.4 10*3/uL (ref 4.0–10.5)

## 2014-08-04 NOTE — Progress Notes (Signed)
Pt reports nausea and lightheadedness when blood drawn but no feels better now. Less pain but still with some present around/below umbilicus. No emesis. No flatus. No burping/belching  Alert, nontoxic, not ill appearing Soft, nd, mild periumbilical/infraumbilical TTP; no rt/guarding/peritonitis  No fever, tachycardia, wbc, less pain Starts clears and monitor Cont IV abx Ambulate Discussed that plan could change on a daily basis depending on his clinical condition (if fever, increasing wbc, worsening wbc, failure to improve - would make us reconsider non-op plan) If pt successful, i think he will need interval appy in future given calcification in appx  Mary SellaEric M. Andrey CampanileWilson, MD, FACS General, Bariatric, & Minimally Invasive Surgery [facility] [place]

## 2014-08-04 NOTE — Progress Notes (Signed)
Patient ID: Billy Colon, male   DOB: 04-09-1971, 43 y.o.   MRN: [id number]     [facility]      [address] [place], [place] [address]    Phone: [phone] FAX: [phone]     Subjective: Afebrile.  VSS.  No fevers.  Reports nausea and diaphoresis, started after his blood was drawn and resolved shortly thereafter.  Less pain.  No vomiting.  No flatus.  Adequate UOP.    Objective:  Vital signs:  Filed Vitals:   08/03/14 1800 08/03/14 2150 08/04/14 0147 08/04/14 0611  BP: 131/69 118/64 130/67 113/64  Pulse: 63 62 72 65  Temp: 98 F (36.7 C) 98.9 F (37.2 C) 100.2 F (37.9 C) 99.6 F (37.6 C)  TempSrc: Oral Oral Oral Oral  Resp: 18 18 18 18   Height:      Weight:      SpO2: 100% 100% 99% 99%    Last BM Date: 08/02/14  Intake/Output   Yesterday:  10/01 0701 - 10/02 0700 In: 1800 [I.V.:1800] Out: 2800 [Urine:2800] This shift: I/O last 3 completed shifts: In: 2162.5 [I.V.:2162.5] Out: 2800 [Urine:2800]    Physical Exam:  General: Pt awake/alert/oriented x4 in no acute distress  Chest: cta. No chest wall pain w good excursion  CV: Pulses intact. Regular rhythm  MS: Normal AROM mjr joints. No obvious deformity  Abdomen: Soft. Nondistended. mildTTP rlq, llq without guarding. No evidence of peritonitis. No incarcerated hernias.  Ext: SCDs BLE. No mjr edema. No cyanosis  Skin: No petechiae / purpura   Problem List:   Active Problems:   Appendicitis   Ruptured appendicitis    Results:   Labs: Results for orders placed during the hospital encounter of 08/02/14 (from the past 48 hour(s))  URINALYSIS, ROUTINE W REFLEX MICROSCOPIC     Status: Abnormal   Collection Time    08/02/14  3:20 PM      Result Value Ref Range   Color, Urine AMBER (*) YELLOW   Comment: BIOCHEMICALS MAY BE AFFECTED BY COLOR   APPearance CLEAR  CLEAR   Specific Gravity, Urine 1.036 (*) 1.005 - 1.030   pH 6.0  5.0 - 8.0   Glucose, UA  NEGATIVE  NEGATIVE mg/dL   Hgb urine dipstick NEGATIVE  NEGATIVE   Bilirubin Urine NEGATIVE  NEGATIVE   Ketones, ur NEGATIVE  NEGATIVE mg/dL   Protein, ur 30 (*) NEGATIVE mg/dL   Urobilinogen, UA 1.0  0.0 - 1.0 mg/dL   Nitrite NEGATIVE  NEGATIVE   Leukocytes, UA NEGATIVE  NEGATIVE  URINE MICROSCOPIC-ADD ON     Status: None   Collection Time    08/02/14  3:20 PM      Result Value Ref Range   Urine-Other       Value: NO FORMED ELEMENTS SEEN ON URINE MICROSCOPIC EXAMINATION  LIPASE, BLOOD     Status: None   Collection Time    08/02/14  3:51 PM      Result Value Ref Range   Lipase 29  11 - 59 U/L  CBC WITH DIFFERENTIAL     Status: Abnormal   Collection Time    08/02/14  3:52 PM      Result Value Ref Range   WBC 10.3  4.0 - 10.5 K/uL   RBC 5.13  4.22 - 5.81 MIL/uL   Hemoglobin 14.4  13.0 - 17.0 g/dL   HCT 42.9  39.0 - 52.0 %   MCV 83.6  78.0 - 100.0 fL   MCH 28.1  26.0 - 34.0 pg   MCHC 33.6  30.0 - 36.0 g/dL   RDW 14.5  11.5 - 15.5 %   Platelets 225  150 - 400 K/uL   Neutrophils Relative % 75  43 - 77 %   Neutro Abs 7.8 (*) 1.7 - 7.7 K/uL   Lymphocytes Relative 15  12 - 46 %   Lymphs Abs 1.5  0.7 - 4.0 K/uL   Monocytes Relative 9  3 - 12 %   Monocytes Absolute 0.9  0.1 - 1.0 K/uL   Eosinophils Relative 1  0 - 5 %   Eosinophils Absolute 0.1  0.0 - 0.7 K/uL   Basophils Relative 0  0 - 1 %   Basophils Absolute 0.0  0.0 - 0.1 K/uL  COMPREHENSIVE METABOLIC PANEL     Status: Abnormal   Collection Time    08/02/14  3:52 PM      Result Value Ref Range   Sodium 137  137 - 147 mEq/L   Potassium 4.4  3.7 - 5.3 mEq/L   Chloride 99  96 - 112 mEq/L   CO2 27  19 - 32 mEq/L   Glucose, Bld 103 (*) 70 - 99 mg/dL   BUN 15  6 - 23 mg/dL   Creatinine, Ser 0.91  0.50 - 1.35 mg/dL   Calcium 9.0  8.4 - 10.5 mg/dL   Total Protein 7.9  6.0 - 8.3 g/dL   Albumin 3.5  3.5 - 5.2 g/dL   AST 22  0 - 37 U/L   ALT 25  0 - 53 U/L   Alkaline Phosphatase 125 (*) 39 - 117 U/L   Total Bilirubin 1.0   0.3 - 1.2 mg/dL   GFR calc non Af Amer >90  >90 mL/min   GFR calc Af Amer >90  >90 mL/min   Comment: (NOTE)     The eGFR has been calculated using the CKD EPI equation.     This calculation has not been validated in all clinical situations.     eGFR's persistently <90 mL/min signify possible Chronic Kidney     Disease.   Anion gap 11  5 - 15  CBC WITH DIFFERENTIAL     Status: None   Collection Time    08/04/14  4:22 AM      Result Value Ref Range   WBC 10.4  4.0 - 10.5 K/uL   RBC 4.93  4.22 - 5.81 MIL/uL   Hemoglobin 13.5  13.0 - 17.0 g/dL   HCT 41.4  39.0 - 52.0 %   MCV 84.0  78.0 - 100.0 fL   MCH 27.4  26.0 - 34.0 pg   MCHC 32.6  30.0 - 36.0 g/dL   RDW 14.3  11.5 - 15.5 %   Platelets 213  150 - 400 K/uL   Neutrophils Relative % 74  43 - 77 %   Neutro Abs 7.7  1.7 - 7.7 K/uL   Lymphocytes Relative 15  12 - 46 %   Lymphs Abs 1.5  0.7 - 4.0 K/uL   Monocytes Relative 10  3 - 12 %   Monocytes Absolute 1.0  0.1 - 1.0 K/uL   Eosinophils Relative 1  0 - 5 %   Eosinophils Absolute 0.1  0.0 - 0.7 K/uL   Basophils Relative 0  0 - 1 %   Basophils Absolute 0.0  0.0 - 0.1 K/uL    Imaging / Studies:  Ct Abdomen Pelvis W Contrast  08/02/2014   CLINICAL DATA:  Low abdominal pain  EXAM: CT ABDOMEN AND PELVIS WITH CONTRAST  TECHNIQUE: Multidetector CT imaging of the abdomen and pelvis was performed using the standard protocol following bolus administration of intravenous contrast.  CONTRAST:  75m OMNIPAQUE IOHEXOL 300 MG/ML SOLN, 1054mOMNIPAQUE IOHEXOL 300 MG/ML SOLN  COMPARISON:  None.  FINDINGS: The lung bases are free of acute infiltrate or sizable effusion. No parenchymal nodules are seen.  The liver, gallbladder, spleen, adrenal glands and pancreas are all normal in their CT appearance. The kidneys demonstrate a normal enhancement pattern bilaterally. No renal calculi or obstructive changes are seen.  The appendix is well visualized and appears within normal limits centrally at its junction  with the ascending colon. More peripherally there are calcifications within and the appendix terminates in the region of a multiloculated fluid collection which measures approximately 4.4 x 3.7 cm. Although this may simply represent an abnormally dilated distal appendix, the possibility of localized rupture of appendicitis with a small multiloculated abscess would deserve consideration. This is not amenable to percutaneous drainage as no clear window is noted.  The bladder is well distended. No free fluid is noted within the pelvis. The osseous structures show no acute abnormality.  IMPRESSION: Changes consistent with appendicitis either within abnormally dilated appendiceal tip or a localized rupture with contained multiloculated abscess. It is not amenable to percutaneous intervention due to its position deep within this pelvis overlying the lumbosacral junction. Surgical consultation is recommended.   Electronically Signed   By: MaInez Catalina.D.   On: 08/02/2014 19:24    Medications / Allergies:  Scheduled Meds: . cefOXitin  1 g Intravenous 4 times per day  . docusate sodium  100 mg Oral BID  . enoxaparin (LOVENOX) injection  40 mg Subcutaneous QHS   Continuous Infusions: . dextrose 5 % and 0.45 % NaCl with KCl 20 mEq/L 75 mL (08/04/14 0608)   PRN Meds:.acetaminophen, acetaminophen, diphenhydrAMINE, diphenhydrAMINE, lip balm, morphine injection, ondansetron  Antibiotics: Anti-infectives   Start     Dose/Rate Route Frequency Ordered Stop   08/03/14 0000  cefOXitin (MEFOXIN) 1 g in dextrose 5 % 50 mL IVPB     1 g 100 mL/hr over 30 Minutes Intravenous 4 times per day 08/02/14 2307         Assessment/Plan  Focally ruptured appendicitis  -pain has improved, afebrile, VSS. WBC normal.  Will continue with non operative management.  The patient will likely require an interval appendectomy, hopefully we can avoid in the acute phase.  -continue with Cefoxitin  -SCD/lovenox  -mobilize  -give  clears today -IV hydration  -repeat CBC in AM   EmErby [facility]ianAN[facility] Pager [phone](7A-4:30P)   08/04/2014 9:08 AM

## 2014-08-05 LAB — CBC WITH DIFFERENTIAL/PLATELET
Basophils Absolute: 0 10*3/uL (ref 0.0–0.1)
Basophils Relative: 0 % (ref 0–1)
Eosinophils Absolute: 0.1 10*3/uL (ref 0.0–0.7)
Eosinophils Relative: 2 % (ref 0–5)
HEMATOCRIT: 40.3 % (ref 39.0–52.0)
Hemoglobin: 13.3 g/dL (ref 13.0–17.0)
LYMPHS PCT: 18 % (ref 12–46)
Lymphs Abs: 1.2 10*3/uL (ref 0.7–4.0)
MCH: 27.3 pg (ref 26.0–34.0)
MCHC: 33 g/dL (ref 30.0–36.0)
MCV: 82.6 fL (ref 78.0–100.0)
MONO ABS: 1 10*3/uL (ref 0.1–1.0)
Monocytes Relative: 15 % — ABNORMAL HIGH (ref 3–12)
NEUTROS ABS: 4.5 10*3/uL (ref 1.7–7.7)
Neutrophils Relative %: 65 % (ref 43–77)
PLATELETS: 225 10*3/uL (ref 150–400)
RBC: 4.88 MIL/uL (ref 4.22–5.81)
RDW: 14.1 % (ref 11.5–15.5)
WBC: 6.9 10*3/uL (ref 4.0–10.5)

## 2014-08-05 NOTE — Progress Notes (Signed)
Patient ID: Billy Colon, male   DOB: October 27, 1971, 43 y.o.   MRN: [id number]  [facility]  Subjective: Patient in bed, family at bedside.  Mild pain.  Denies nausea or emesis.  No BM or flatus per pt.  Objective: Vital signs in last 24 hours: Temp:  [98.6 F (37 C)-99.2 F (37.3 C)] 98.9 F (37.2 C) (10/03 0600) Pulse Rate:  [61-65] 61 (10/03 0600) Resp:  [16-18] 16 (10/03 0600) BP: (113-119)/(56-61) 113/59 mmHg (10/03 0600) SpO2:  [98 %-100 %] 98 % (10/03 0600) Last BM Date: 08/02/14  Intake/Output from previous day: 10/02 0701 - 10/03 0700 In: 2640 [P.O.:840; I.V.:1700; IV Piggyback:100] Out: 950 [Urine:950]  Exam: HEENT - clear, not icteric Neck - soft Chest - clear bilaterally Cor - RRR, no murmur Abd - few BS present; mild diffuse tenderness, max suprapubic Ext - no significant edema Neuro - grossly intact, no focal deficits  Lab Results:   Recent Labs  08/04/14 0422 08/05/14 0537  WBC 10.4 6.9  HGB 13.5 13.3  HCT 41.4 40.3  PLT 213 225     Recent Labs  08/02/14 1552  NA 137  K 4.4  CL 99  CO2 27  GLUCOSE 103*  BUN 15  CREATININE 0.91  CALCIUM 9.0    Studies/Results: No results found.  Assessment / Plan: 1.  Perforated acute appendicitis  IV cefoxitin  Advance to full liquid diet  Ambulate  WBC now normal  Velora Hecklerodd M. Berlin Viereck, MD, [facility] Office: [phone]  08/05/2014

## 2014-08-06 NOTE — Progress Notes (Signed)
Patient ID: Billy Colon, male   DOB: 1970/12/25, 43 y.o.   MRN: [id number]  General [facility] - Progress Note  Subjective: Patient more comfortable today.  Less pain.  Two BM's overnight, second one loose.  Tolerating full liquid diet.  Objective: Vital signs in last 24 hours: Temp:  [98.4 F (36.9 C)-99.2 F (37.3 C)] 99.2 F (37.3 C) (10/04 0541) Pulse Rate:  [67-71] 67 (10/04 0541) Resp:  [16-18] 18 (10/04 0541) BP: (90-120)/(42-92) 90/58 mmHg (10/04 0541) SpO2:  [100 %] 100 % (10/04 0541) Last BM Date: 08/05/14  Intake/Output from previous day: 10/03 0701 - 10/04 0700 In: 3090 [P.O.:1140; I.V.:1750; IV Piggyback:200] Out: -   Exam: HEENT - clear, not icteric Abd - soft without distension; mild tenderness suprapubic region Ext - no significant edema Neuro - grossly intact, no focal deficits  Lab Results:   Recent Labs  08/04/14 0422 08/05/14 0537  WBC 10.4 6.9  HGB 13.5 13.3  HCT 41.4 40.3  PLT 213 225    No results found for this basename: NA, K, CL, CO2, GLUCOSE, BUN, CREATININE, CALCIUM,  in the last 72 hours  Studies/Results: No results found.  Assessment / Plan: 1.  Perforated acute appendicitis  IV Cefoxitin  Advance to regular diet  Ambulate  Check CBC in AM 10/5  Possible discharge on oral abx 10/5  Velora Hecklerodd M. Jordyan Hardiman, MD, [facility] Office: [phone]  08/06/2014

## 2014-08-07 LAB — CBC
HEMATOCRIT: 39.1 % (ref 39.0–52.0)
HEMOGLOBIN: 12.9 g/dL — AB (ref 13.0–17.0)
MCH: 27.6 pg (ref 26.0–34.0)
MCHC: 33 g/dL (ref 30.0–36.0)
MCV: 83.5 fL (ref 78.0–100.0)
Platelets: 228 10*3/uL (ref 150–400)
RBC: 4.68 MIL/uL (ref 4.22–5.81)
RDW: 14 % (ref 11.5–15.5)
WBC: 6.3 10*3/uL (ref 4.0–10.5)

## 2014-08-07 MED ORDER — SACCHAROMYCES BOULARDII 250 MG PO CAPS
250.0000 mg | ORAL_CAPSULE | Freq: Two times a day (BID) | ORAL | Status: DC
  Administered 2014-08-07: 250 mg via ORAL
  Filled 2014-08-07 (×3): qty 1

## 2014-08-07 MED ORDER — NAPROXEN SODIUM 220 MG PO TABS: ORAL_TABLET | ORAL | Status: AC

## 2014-08-07 MED ORDER — OXYCODONE-ACETAMINOPHEN 5-325 MG PO TABS: 1.0000 | ORAL_TABLET | ORAL | Status: DC | PRN

## 2014-08-07 MED ORDER — AMOXICILLIN-POT CLAVULANATE 875-125 MG PO TABS
1.0000 | ORAL_TABLET | Freq: Two times a day (BID) | ORAL | Status: DC
  Administered 2014-08-07: 1 via ORAL
  Filled 2014-08-07 (×2): qty 1

## 2014-08-07 MED ORDER — ACETAMINOPHEN 325 MG PO TABS: 650.0000 mg | ORAL_TABLET | Freq: Four times a day (QID) | ORAL | Status: AC | PRN

## 2014-08-07 MED ORDER — SACCHAROMYCES BOULARDII 250 MG PO CAPS: ORAL_CAPSULE | ORAL | Status: AC

## 2014-08-07 MED ORDER — AMOXICILLIN-POT CLAVULANATE 875-125 MG PO TABS: 1.0000 | ORAL_TABLET | Freq: Two times a day (BID) | ORAL | Status: DC

## 2014-08-07 NOTE — Progress Notes (Signed)
  Subjective: He looks great, abdomen isn't tender.  Tolerating diet and having BM's.    Objective: Vital signs in last 24 hours: Temp:  [97.7 F (36.5 C)-98.6 F (37 C)] 97.9 F (36.6 C) (10/05 0528) Pulse Rate:  [56-67] 56 (10/05 0528) Resp:  [16-17] 17 (10/05 0528) BP: (92-113)/(51-61) 103/52 mmHg (10/05 0528) SpO2:  [100 %] 100 % (10/05 0528) Last BM Date: 08/05/14 600 Po recorded BM x 4 yesterday Regular diet Afebrile, VSS WBC is 6.3  Intake/Output from previous day: 10/04 0701 - 10/05 0700 In: 2052.1 [P.O.:600; I.V.:1252.1; IV Piggyback:200] Out: -  Intake/Output this shift:    General appearance: alert, cooperative and no distress GI: soft, non-tender; bowel sounds normal; no masses,  no organomegaly  Lab Results:   Recent Labs  08/05/14 0537 08/07/14 0520  WBC 6.9 6.3  HGB 13.3 12.9*  HCT 40.3 39.1  PLT 225 228    BMET No results found for this basename: NA, K, CL, CO2, GLUCOSE, BUN, CREATININE, CALCIUM,  in the last 72 hours PT/INR No results found for this basename: LABPROT, INR,  in the last 72 hours   Recent Labs Lab 08/02/14 1552  AST 22  ALT 25  ALKPHOS 125*  BILITOT 1.0  PROT 7.9  ALBUMIN 3.5     Lipase     Component Value Date/Time   LIPASE 29 08/02/2014 1551     Studies/Results: No results found.  Medications: . cefOXitin  1 g Intravenous 4 times per day  . docusate sodium  100 mg Oral BID  . enoxaparin (LOVENOX) injection  40 mg Subcutaneous QHS    Assessment/Plan Appendicitis;  with focally ruptured appendix (admit 08/02/14)  Medical management; day 5 of Cefoxitin   Plan:  I am going to switch him to Augmentin, start a probiotic, and get him setup to follow up with Dr. Maisie Fushomas in the office.  9 more days of antibiotics.   Hope to send home later today.  I have an appointment for him to follow up with Dr. Maisie Fushomas next Tuesday, 08/15/14 at 11:45, with Dr. Maisie Fushomas.    LOS: 5 days    JENNINGS,WILLARD 08/07/2014  Agree  with above. Wife in room.  He looks good. Anticipates going home this PM.  Ovidio Kinavid Lauria Depoy, MD, [facility] Pager: [phone] Office phone:  [phone]

## 2014-08-07 NOTE — Discharge Instructions (Signed)
Appendicitis Appendicitis is when the appendix is swollen (inflamed). The inflammation can lead to developing a hole (perforation) and a collection of pus (abscess). CAUSES  There is not always an obvious cause of appendicitis. Sometimes it is caused by an obstruction in the appendix. The obstruction can be caused by:  A small, hard, pea-sized ball of stool (fecalith).  Enlarged lymph glands in the appendix. SYMPTOMS   Pain around your belly button (navel) that moves toward your lower right belly (abdomen). The pain can become more severe and sharp as time passes.  Tenderness in the lower right abdomen. Pain gets worse if you cough or make a sudden movement.  Feeling sick to your stomach (nauseous).  Throwing up (vomiting).  Loss of appetite.  Fever.  Constipation.  Diarrhea.  Generally not feeling well. DIAGNOSIS   Physical exam.  Blood tests.  Urine test.  X-rays or a CT scan may confirm the diagnosis. TREATMENT  Once the diagnosis of appendicitis is made, the most common treatment is to remove the appendix as soon as possible. This procedure is called appendectomy. In an open appendectomy, a cut (incision) is made in the lower right abdomen and the appendix is removed. In a laparoscopic appendectomy, usually 3 small incisions are made. Long, thin instruments and a camera tube are used to remove the appendix. Most patients go home in 24 to 48 hours after appendectomy. In some situations, the appendix may have already perforated and an abscess may have formed. The abscess may have a "wall" around it as seen on a CT scan. In this case, a drain may be placed into the abscess to remove fluid, and you may be treated with antibiotic medicines that kill germs. The medicine is given through a tube in your vein (IV). Once the abscess has resolved, it may or may not be necessary to have an appendectomy. You may need to stay in the hospital longer than 48 hours. We have been treating  you with medicines only call for:  Fever, increased pain, nausea or vomiting, you find you are having increased diarrhea, decreased urine output.   Document Released: 10/20/2005 Document Revised: 04/20/2012 Document Reviewed: 01/15/2010 [facility] Patient Information 2015 [place], [place]. This information is not intended to replace advice given to you by your health care provider. Make sure you discuss any questions you have with your health care provider.

## 2014-08-11 NOTE — Discharge Summary (Signed)
  Sherrie GeorgeWillard Jennings, PA-C Physician Assistant Cosign Needed Surgery Progress Notes Service date: 08/11/2014 4:24 PM  Physician Discharge Summary   Patient ID: Billy Colon MRN: [id number] DOB/AGE: July 27, 1971 43 y.o.  Admit date: 08/02/2014 Discharge date: 08/07/2014  Admission Diagnoses:  Appendicitis; with focally ruptured appendix (admit 08/02/14)  Discharge Diagnoses:   Same Active Problems:   Appendicitis   Ruptured appendicitis   PROCEDURES: Medical management   Hospital Course: Pt presents to the Emergency Department complaining of gradual, persistent, nagging generalized and lower abd pain with associated urnary frequency onset 5 days ago. Associated symptoms include subjective fever for the first day which ha not reoccured. His bowel habits have been regular. Pt denies chills, headache, N/V/D. He was admitted by Dr. Maisie Fushomas, with plans for medical management.  He slowly improved and by 08/07/14 wast atking a soft regular diet and doing well.  He was sent home on a total of 14 days of antibiotics.  He will follow up with Dr. Maisie Fushomas.  Condition on d/c:  Improved      CBC  Latest Ref Rng  08/07/2014  08/05/2014  08/04/2014   WBC  4.0 - 10.5 K/uL  6.3  6.9  10.4   Hemoglobin  13.0 - 17.0 g/dL  12.9(L)  13.3  13.5   Hematocrit  39.0 - 52.0 %  39.1  40.3  41.4   Platelets  150 - 400 K/uL  228  225  213       Disposition: 01-Home or Self Care      Medication List              acetaminophen 325 MG tablet   Commonly known as:  TYLENOL   Take 2 tablets (650 mg total) by mouth every 6 (six) hours as needed for mild pain (or Temp > 100).        amoxicillin-clavulanate 875-125 MG per tablet   Commonly known as:  AUGMENTIN   Take 1 tablet by mouth every 12 (twelve) hours.        naproxen sodium 220 MG tablet   Commonly known as:  ANAPROX   You can take this or plain Tylenol (acetaminophen) as needed for pain.        oxyCODONE-acetaminophen 5-325 MG per tablet   Commonly known  as:  PERCOCET/ROXICET   Take 1-2 tablets by mouth every 4 (four) hours as needed for moderate pain.        saccharomyces boulardii 250 MG capsule   Commonly known as:  FLORASTOR   You can get any Probiotic over the counter and any drug store.  I would use it till you are completely off the antibiotics for a week or more.                Follow-up Information     Follow up with Vanita PandaHOMAS, ALICIA C., MD On 08/15/2014. (Be at the office for check in at 11:45 AM.)      Specialty:  General Surgery     Contact information:     [address]  [place] [address] [phone]         Signed: Sherrie GeorgeJENNINGS,WILLARD 08/11/2014, 4:24 PM    Agree with above.  Ovidio Kinavid Raden Byington, MD, [facility] Pager: [phone] Office phone:  [phone]

## 2014-08-11 NOTE — Progress Notes (Signed)
Physician Discharge Summary  Patient ID: Billy Colon MRN: [id number] DOB/AGE: [birth year] 43 y.o.  Admit date: 08/02/2014 Discharge date: 08/07/2014  Admission Diagnoses:  Appendicitis; with focally ruptured appendix (admit 08/02/14)  Discharge Diagnoses:  Same Active Problems:   Appendicitis   Ruptured appendicitis   PROCEDURES: Medical management   Hospital Course: Pt presents to the Emergency Department complaining of gradual, persistent, nagging generalized and lower abd pain with associated urnary frequency onset 5 days ago. Associated symptoms include subjective fever for the first day which ha not reoccured. His bowel habits have been regular. Pt denies chills, headache, N/V/D. He was admitted by Dr. Maisie Fushomas, with plans for medical management.  He slowly improved and by 08/07/14 wast atking a soft regular diet and doing well.  He was sent home on a total of 14 days of antibiotics.  He will follow up with Dr. Maisie Fushomas.  Condition on d/c:  Improved    CBC Latest Ref Rng 08/07/2014 08/05/2014 08/04/2014  WBC 4.0 - 10.5 K/uL 6.3 6.9 10.4  Hemoglobin 13.0 - 17.0 g/dL 12.9(L) 13.3 13.5  Hematocrit 39.0 - 52.0 % 39.1 40.3 41.4  Platelets 150 - 400 K/uL 228 225 213      Disposition: 01-Home or Self Care     Medication List         acetaminophen 325 MG tablet  Commonly known as:  TYLENOL  Take 2 tablets (650 mg total) by mouth every 6 (six) hours as needed for mild pain (or Temp > 100).     amoxicillin-clavulanate 875-125 MG per tablet  Commonly known as:  AUGMENTIN  Take 1 tablet by mouth every 12 (twelve) hours.     naproxen sodium 220 MG tablet  Commonly known as:  ANAPROX  You can take this or plain Tylenol (acetaminophen) as needed for pain.     oxyCODONE-acetaminophen 5-325 MG per tablet  Commonly known as:  PERCOCET/ROXICET  Take 1-2 tablets by mouth every 4 (four) hours as needed for moderate pain.     saccharomyces boulardii 250 MG capsule  Commonly known as:   FLORASTOR  You can get any Probiotic over the counter and any drug store.  I would use it till you are completely off the antibiotics for a week or more.           Follow-up Information   Follow up with Vanita PandaHOMAS, ALICIA C., MD On 08/15/2014. (Be at the office for check in at 11:45 AM.)    Specialty:  General Surgery   Contact information:   [address] [place] [id number] [phone]       Signed: Sherrie GeorgeJENNINGS,WILLARD 08/11/2014, 4:24 PM   Agree with above.  Ovidio Kinavid Ulmer Degen, MD, [facility] Pager: [phone] Office phone:  [phone]

## 2015-06-15 ENCOUNTER — Inpatient Hospital Stay (HOSPITAL_COMMUNITY)
Admission: EM | Admit: 2015-06-15 | Discharge: 2015-06-18 | DRG: 373 | Disposition: A | Discharge status: Home / Self Care | Payer: Self-pay | Attending: Surgery | Admitting: Surgery

## 2015-06-15 ENCOUNTER — Encounter (HOSPITAL_COMMUNITY): Payer: Self-pay | Admitting: Emergency Medicine

## 2015-06-15 ENCOUNTER — Emergency Department (HOSPITAL_COMMUNITY): Payer: Self-pay

## 2015-06-15 DIAGNOSIS — K353 Acute appendicitis with localized peritonitis: Principal | ICD-10-CM | POA: Diagnosis present

## 2015-06-15 DIAGNOSIS — K3533 Acute appendicitis with perforation and localized peritonitis, with abscess: Secondary | ICD-10-CM | POA: Diagnosis present

## 2015-06-15 DIAGNOSIS — K3532 Acute appendicitis with perforation and localized peritonitis, without abscess: Secondary | ICD-10-CM

## 2015-06-15 LAB — URINALYSIS, ROUTINE W REFLEX MICROSCOPIC
Bilirubin Urine: NEGATIVE
Glucose, UA: NEGATIVE mg/dL
Hgb urine dipstick: NEGATIVE
Ketones, ur: NEGATIVE mg/dL
LEUKOCYTES UA: NEGATIVE
NITRITE: NEGATIVE
Protein, ur: NEGATIVE mg/dL
SPECIFIC GRAVITY, URINE: 1.034 — AB (ref 1.005–1.030)
Urobilinogen, UA: 1 mg/dL (ref 0.0–1.0)
pH: 6 (ref 5.0–8.0)

## 2015-06-15 LAB — COMPREHENSIVE METABOLIC PANEL
ALBUMIN: 4.2 g/dL (ref 3.5–5.0)
ALT: 30 U/L (ref 17–63)
ANION GAP: 7 (ref 5–15)
AST: 34 U/L (ref 15–41)
Alkaline Phosphatase: 83 U/L (ref 38–126)
BILIRUBIN TOTAL: 1.1 mg/dL (ref 0.3–1.2)
BUN: 21 mg/dL — ABNORMAL HIGH (ref 6–20)
CO2: 28 mmol/L (ref 22–32)
CREATININE: 0.99 mg/dL (ref 0.61–1.24)
Calcium: 9.4 mg/dL (ref 8.9–10.3)
Chloride: 105 mmol/L (ref 101–111)
GFR calc Af Amer: >60 mL/min (ref >60)
GFR calc non Af Amer: >60 mL/min (ref >60)
GLUCOSE: 88 mg/dL (ref 65–99)
Potassium: 4.2 mmol/L (ref 3.5–5.1)
Sodium: 140 mmol/L (ref 135–145)
TOTAL PROTEIN: 8.2 g/dL — AB (ref 6.5–8.1)

## 2015-06-15 LAB — CBC
HCT: 43.4 % (ref 39.0–52.0)
Hemoglobin: 14.3 g/dL (ref 13.0–17.0)
MCH: 28 pg (ref 26.0–34.0)
MCHC: 32.9 g/dL (ref 30.0–36.0)
MCV: 85.1 fL (ref 78.0–100.0)
Platelets: 178 10*3/uL (ref 150–400)
RBC: 5.1 MIL/uL (ref 4.22–5.81)
RDW: 14.5 % (ref 11.5–15.5)
WBC: 7.6 10*3/uL (ref 4.0–10.5)

## 2015-06-15 LAB — LIPASE, BLOOD: LIPASE: 22 U/L (ref 22–51)

## 2015-06-15 MED ORDER — IOHEXOL 300 MG/ML  SOLN
50.0000 mL | Freq: Once | INTRAMUSCULAR | Status: AC | PRN
  Administered 2015-06-15: 50 mL via ORAL

## 2015-06-15 MED ORDER — SODIUM CHLORIDE 0.9 % IV BOLUS (SEPSIS)
1000.0000 mL | Freq: Once | INTRAVENOUS | Status: AC
  Administered 2015-06-15: 1000 mL via INTRAVENOUS

## 2015-06-15 NOTE — ED Notes (Signed)
Pt from home c/o RLQ abdominal pain. He reports that he was treated last year for appendicitis with antibiotics but appendix was not removed. He reports he is having same symptoms now.

## 2015-06-15 NOTE — ED Notes (Signed)
Patient did not have an IV on arrival.

## 2015-06-15 NOTE — ED Provider Notes (Signed)
CSN: [id number]   Arrival date & time 06/15/15 2215  History  This chart was scribed for  Tomasita Crumble, MD by Bethel Born, ED Scribe. This patient was seen in room WA22/WA22 and the patient's care was started at 11:14 PM.  Chief Complaint  Patient presents with  . Abdominal Pain    HPI The history is provided by the patient and the spouse. No language interpreter was used.   Billy Colon is a 44 y.o. male who presents to the Emergency Department complaining of constant right lower abdominal pain with gradual onset 3 days ago. Pt rates the pain 7/10 in severity. He describes the pain as pressure and notes that he had similar pain last September when his appendix ruptured. At that time he was treated with antibiotics and did have an appendectomy. Over the last 3 days he has been using Augmentin that he had leftover. He has had 3 doses with the last one being tonight.  Associated symptoms include subjective fever, headache, decreased appetite ("it just feels like I'm full already"), increased urinary frequency, dysuria, and lower back pain (stiffness). Pt denies vomiting and hematuria.  Past Medical History  Diagnosis Date  . Medical history non-contributory     Past Surgical History  Procedure Laterality Date  . No past surgeries      No family history on file.  Social History  Substance Use Topics  . Smoking status: Never Smoker   . Smokeless tobacco: Never Used  . Alcohol Use: Yes     Comment: Occasional     Review of Systems 10 Systems reviewed and all are negative for acute change except as noted in the HPI.  Home Medications   Prior to Admission medications   Medication Sig Start Date End Date Taking? Authorizing Provider  amoxicillin-clavulanate (AUGMENTIN) 875-125 MG per tablet Take 1 tablet by mouth every 12 (twelve) hours. 08/07/14  Yes Sherrie George, PA-C  OIL OF OREGANO PO Take 1 capsule by mouth daily.   Yes Historical Provider, MD  acetaminophen (TYLENOL) 325  MG tablet Take 2 tablets (650 mg total) by mouth every 6 (six) hours as needed for mild pain (or Temp > 100). Patient not taking: Reported on 06/15/2015 08/07/14   Sherrie George, PA-C  naproxen sodium (ANAPROX) 220 MG tablet You can take this or plain Tylenol (acetaminophen) as needed for pain. Patient not taking: Reported on 06/15/2015 08/07/14   Sherrie George, PA-C  oxyCODONE-acetaminophen (PERCOCET/ROXICET) 5-325 MG per tablet Take 1-2 tablets by mouth every 4 (four) hours as needed for moderate pain. Patient not taking: Reported on 06/15/2015 08/07/14   Sherrie George, PA-C  saccharomyces boulardii (FLORASTOR) 250 MG capsule You can get any Probiotic over the counter and any drug store.  I would use it till you are completely off the antibiotics for a week or more. Patient not taking: Reported on 06/15/2015 08/07/14   Sherrie George, PA-C    Allergies  Pollen extract  Triage Vitals: BP 125/76 mmHg  Pulse 66  Temp(Src) 98.3 F (36.8 C) (Oral)  Resp 18  SpO2 100%  Physical Exam  Constitutional: He is oriented to person, place, and time. Vital signs are normal. He appears well-developed and well-nourished.  Non-toxic appearance. He does not appear ill. No distress.  HENT:  Head: Normocephalic and atraumatic.  Nose: Nose normal.  Mouth/Throat: Oropharynx is clear and moist. No oropharyngeal exudate.  Eyes: Conjunctivae and EOM are normal. Pupils are equal, round, and reactive to light. No scleral icterus.  Neck: Normal range of motion. Neck supple. No tracheal deviation, no edema, no erythema and normal range of motion present. No thyroid mass and no thyromegaly present.  Cardiovascular: Normal rate, regular rhythm, S1 normal, S2 normal, normal heart sounds, intact distal pulses and normal pulses.  Exam reveals no gallop and no friction rub.   No murmur heard. Pulses:      Radial pulses are 2+ on the right side, and 2+ on the left side.       Dorsalis pedis pulses are 2+ on the  right side, and 2+ on the left side.  Pulmonary/Chest: Effort normal and breath sounds normal. No respiratory distress. He has no wheezes. He has no rhonchi. He has no rales.  Abdominal: Soft. Normal appearance and bowel sounds are normal. He exhibits no distension, no ascites and no mass. There is no hepatosplenomegaly. There is tenderness. There is no rebound, no guarding and no CVA tenderness.  RLQ TTP +Rovsing sign  Musculoskeletal: Normal range of motion. He exhibits no edema or tenderness.  Lymphadenopathy:    He has no cervical adenopathy.  Neurological: He is alert and oriented to person, place, and time. He has normal strength. No cranial nerve deficit or sensory deficit.  Skin: Skin is warm, dry and intact. No petechiae and no rash noted. He is not diaphoretic. No erythema. No pallor.  Psychiatric: He has a normal mood and affect. His behavior is normal. Judgment normal.  Nursing note and vitals reviewed.   ED Course  Procedures   DIAGNOSTIC STUDIES: Oxygen Saturation is 100% on RA, normal by my interpretation.    COORDINATION OF CARE: 11:20 PM Discussed treatment plan which includes lab work and CT A/P with contrast with pt at bedside and pt agreed to plan. Pt declined pain medication at initial encounter.   Labs Review-  Labs Reviewed  COMPREHENSIVE METABOLIC PANEL - Abnormal; Notable for the following:    BUN 21 (*)    Total Protein 8.2 (*)    All other components within normal limits  URINALYSIS, ROUTINE W REFLEX MICROSCOPIC (NOT AT [facility]) - Abnormal; Notable for the following:    Specific Gravity, Urine 1.034 (*)    All other components within normal limits  LIPASE, BLOOD  CBC    Imaging Review Ct Abdomen Pelvis W Contrast  06/16/2015   CLINICAL DATA:  Acute onset of right lower quadrant abdominal pain and nausea. Initial encounter.  EXAM: CT ABDOMEN AND PELVIS WITH CONTRAST  TECHNIQUE: Multidetector CT imaging of the abdomen and pelvis was performed using the  standard protocol following bolus administration of intravenous contrast.  CONTRAST:  OMNIPAQUE IOHEXOL 300 MG/ML  SOLN  COMPARISON:  None.  FINDINGS: The visualized lung bases are clear.  The liver and spleen are unremarkable in appearance. The gallbladder is within normal limits. The pancreas and adrenal glands are unremarkable.  The kidneys are unremarkable in appearance. There is no evidence of hydronephrosis. No renal or ureteral stones are seen. No perinephric stranding is appreciated.  No free fluid is identified. The small bowel is unremarkable in appearance. The stomach is within normal limits. No acute vascular abnormalities are seen.  There is a recurrent or persistent abscess at the right hemipelvis, measuring approximately 2.5 x 2.1 x 2.1 cm. Increased surrounding soft tissue density is noted. Given the extent of the surrounding soft tissue density, this may reflect a chronic abscess. The previously noted appendicolith at the tail of the appendix has progressed into the abscess.  This likely  reflects the prior perforated appendicitis. Much of the appendix is unremarkable in appearance. Surrounding soft tissue inflammation is noted, tracking about the adjacent distal ileum.  The colon is unremarkable in appearance.  The bladder is mildly distended and grossly unremarkable. The prostate is enlarged, measuring 5.8 cm in transverse dimension, with nodular enhancing extension at the base of the bladder. No inguinal lymphadenopathy is seen.  No acute osseous abnormalities are identified.  IMPRESSION: 1. Recurrent or persistent abscess at the right hemipelvis, measuring 2.5 x 2.1 x 2.1 cm. This is smaller than in 2015, but there is increased surrounding soft tissue density. Given the extent of the surrounding soft tissue density, this may reflect chronic abscess, with acute exacerbation. Associated soft tissue inflammation tracks about the adjacent distal ileum. 2. Previously noted appendicolith at the  tail of the appendix has progressed into the abscess. This likely reflects the prior perforated appendicitis. Much of the appendix is currently unremarkable in appearance. 3. Enlarged prostate noted, with nodular enhancing extension at the base of the bladder. Would correlate with PSA. These results were called by telephone at the time of interpretation on 06/16/2015 at 1:17 am to Dr. Tomasita Crumble, who verbally acknowledged these results.   Electronically Signed   By: Roanna Raider M.D.   On: 06/16/2015 01:17    EKG Interpretation None      MDM   Final diagnoses:  None   Patient presents to the emergency department for 3 days of right lower quadrant abdominal pain. He was treated last year for appendicitis with antibiotics did not have an appendectomy. He states his symptoms are similar now. Physical exam is also concerning for possible appendicitis. Will obtain CT scan for evaluation. Patient is currently not requesting medication for pain or nausea.  CT scan is positive for recurrent appendicitis. I have ordered cefoxitin for the patient. I will consult with general surgery for admission.  I spoke with Dr. Johna Sheriff with general surgery who will see the patient emergency department and likely admit for appendectomy. Patient was informed of results.   I personally performed the services described in this documentation, which was scribed in my presence. The recorded information has been reviewed and is accurate.    Tomasita Crumble, MD 06/16/15 [phone]

## 2015-06-16 DIAGNOSIS — K3533 Acute appendicitis with perforation and localized peritonitis, with abscess: Secondary | ICD-10-CM | POA: Diagnosis present

## 2015-06-16 MED ORDER — OXYCODONE HCL 5 MG PO TABS
5.0000 mg | ORAL_TABLET | ORAL | Status: DC | PRN
  Administered 2015-06-16 (×2): 10 mg via ORAL
  Filled 2015-06-16 (×2): qty 2

## 2015-06-16 MED ORDER — ENOXAPARIN SODIUM 30 MG/0.3ML ~~LOC~~ SOLN
30.0000 mg | Freq: Two times a day (BID) | SUBCUTANEOUS | Status: DC
  Filled 2015-06-16 (×6): qty 0.3

## 2015-06-16 MED ORDER — PIPERACILLIN-TAZOBACTAM 3.375 G IVPB
3.3750 g | Freq: Three times a day (TID) | INTRAVENOUS | Status: DC
  Administered 2015-06-16 – 2015-06-18 (×7): 3.375 g via INTRAVENOUS
  Filled 2015-06-16 (×8): qty 50

## 2015-06-16 MED ORDER — MORPHINE SULFATE 2 MG/ML IJ SOLN
2.0000 mg | INTRAMUSCULAR | Status: DC | PRN
  Administered 2015-06-16 – 2015-06-17 (×3): 2 mg via INTRAVENOUS
  Filled 2015-06-16 (×3): qty 1

## 2015-06-16 MED ORDER — IOHEXOL 300 MG/ML  SOLN
100.0000 mL | Freq: Once | INTRAMUSCULAR | Status: AC | PRN
  Administered 2015-06-16: 100 mL via INTRAVENOUS

## 2015-06-16 MED ORDER — ONDANSETRON 4 MG PO TBDP
4.0000 mg | ORAL_TABLET | Freq: Four times a day (QID) | ORAL | Status: DC | PRN
  Administered 2015-06-16: 4 mg via ORAL
  Filled 2015-06-16: qty 1

## 2015-06-16 MED ORDER — DEXTROSE 5 % IV SOLN
2.0000 g | Freq: Once | INTRAVENOUS | Status: AC
  Administered 2015-06-16: 2 g via INTRAVENOUS
  Filled 2015-06-16: qty 2

## 2015-06-16 MED ORDER — DIPHENHYDRAMINE HCL 50 MG/ML IJ SOLN
12.5000 mg | Freq: Four times a day (QID) | INTRAMUSCULAR | Status: DC | PRN
  Filled 2015-06-16: qty 1

## 2015-06-16 MED ORDER — ONDANSETRON HCL 4 MG/2ML IJ SOLN: 4.0000 mg | Freq: Four times a day (QID) | INTRAMUSCULAR | Status: DC | PRN

## 2015-06-16 NOTE — ED Notes (Signed)
Patient states last year he was diagnosed with appendicitis, but the appendix had already ruptured and was treated with abx.  Patient presents today with lower abdominal pain and nausea since yesterday.  Patient denies fever and V/D.  Patient states pain is worse with movement.

## 2015-06-16 NOTE — H&P (Signed)
Billy Colon is an 44 y.o. male.    Chief Complaint: abdominal pain   HPI: he has a history of presentation with perforated appendicitis and abscess in September 2015. This was treated nonoperatively with antibiotics with improvement and he was discharged on oral antibiotics. Patient was followed up and did not have interval appendectomy. He was doing well without any recurrent symptoms until 3-4 days ago when he developed the gradual onset of lower abdominal pain radiating into the right lower quadrant. The pain persisted and he came to the emergency room today at the request of his family. The pain has been constant and worse with activity. He has had some mild malaise and lack of appetite. No nausea or vomiting. Some subjective fever. No chills. No urinary symptoms. The pain is not severe but persistent.  Past Medical History  Diagnosis Date  . Medical history non-contributory     Past Surgical History  Procedure Laterality Date  . No past surgeries      No family history on file. Social History:  reports that he has never smoked. He has never used smokeless tobacco. He reports that he drinks alcohol. He reports that he does not use illicit drugs.  Allergies:  Allergies  Allergen Reactions  . Pollen Extract Other (See Comments)    Sneezing    Current Facility-Administered Medications  Medication Dose Route Frequency Provider Last Rate Last Dose  . cefOXitin (MEFOXIN) 2 g in dextrose 5 % 50 mL IVPB  2 g Intravenous Once Everlene Balls, MD 100 mL/hr at 06/16/15 0154 2 g at 06/16/15 0154   Current Outpatient Prescriptions  Medication Sig Dispense Refill  . amoxicillin-clavulanate (AUGMENTIN) 875-125 MG per tablet Take 1 tablet by mouth every 12 (twelve) hours. 18 tablet 0  . OIL OF OREGANO PO Take 1 capsule by mouth daily.    Marland Kitchen acetaminophen (TYLENOL) 325 MG tablet Take 2 tablets (650 mg total) by mouth every 6 (six) hours as needed for mild pain (or Temp > 100). (Patient not  taking: Reported on 06/15/2015)    . naproxen sodium (ANAPROX) 220 MG tablet You can take this or plain Tylenol (acetaminophen) as needed for pain. (Patient not taking: Reported on 06/15/2015)    . oxyCODONE-acetaminophen (PERCOCET/ROXICET) 5-325 MG per tablet Take 1-2 tablets by mouth every 4 (four) hours as needed for moderate pain. (Patient not taking: Reported on 06/15/2015) 30 tablet 0  . saccharomyces boulardii (FLORASTOR) 250 MG capsule You can get any Probiotic over the counter and any drug store.  I would use it till you are completely off the antibiotics for a week or more. (Patient not taking: Reported on 06/15/2015)       Results for orders placed or performed during the hospital encounter of 06/15/15 (from the past 48 hour(s))  Lipase, blood     Status: None   Collection Time: 06/15/15 10:56 PM  Result Value Ref Range   Lipase 22 22 - 51 U/L  Comprehensive metabolic panel     Status: Abnormal   Collection Time: 06/15/15 10:56 PM  Result Value Ref Range   Sodium 140 135 - 145 mmol/L   Potassium 4.2 3.5 - 5.1 mmol/L   Chloride 105 101 - 111 mmol/L   CO2 28 22 - 32 mmol/L   Glucose, Bld 88 65 - 99 mg/dL   BUN 21 (H) 6 - 20 mg/dL   Creatinine, Ser 0.99 0.61 - 1.24 mg/dL   Calcium 9.4 8.9 - 10.3 mg/dL   Total  Protein 8.2 (H) 6.5 - 8.1 g/dL   Albumin 4.2 3.5 - 5.0 g/dL   AST 34 15 - 41 U/L   ALT 30 17 - 63 U/L   Alkaline Phosphatase 83 38 - 126 U/L   Total Bilirubin 1.1 0.3 - 1.2 mg/dL   GFR calc non Af Amer >60 >60 mL/min   GFR calc Af Amer >60 >60 mL/min    Comment: (NOTE) The eGFR has been calculated using the CKD EPI equation. This calculation has not been validated in all clinical situations. eGFR's persistently <60 mL/min signify possible Chronic Kidney Disease.    Anion gap 7 5 - 15  CBC     Status: None   Collection Time: 06/15/15 10:56 PM  Result Value Ref Range   WBC 7.6 4.0 - 10.5 K/uL   RBC 5.10 4.22 - 5.81 MIL/uL   Hemoglobin 14.3 13.0 - 17.0 g/dL   HCT  43.4 39.0 - 52.0 %   MCV 85.1 78.0 - 100.0 fL   MCH 28.0 26.0 - 34.0 pg   MCHC 32.9 30.0 - 36.0 g/dL   RDW 14.5 11.5 - 15.5 %   Platelets 178 150 - 400 K/uL  Urinalysis, Routine w reflex microscopic (not at [facility] [place])     Status: Abnormal   Collection Time: 06/15/15 11:03 PM  Result Value Ref Range   Color, Urine YELLOW YELLOW   APPearance CLEAR CLEAR   Specific Gravity, Urine 1.034 (H) 1.005 - 1.030   pH 6.0 5.0 - 8.0   Glucose, UA NEGATIVE NEGATIVE mg/dL   Hgb urine dipstick NEGATIVE NEGATIVE   Bilirubin Urine NEGATIVE NEGATIVE   Ketones, ur NEGATIVE NEGATIVE mg/dL   Protein, ur NEGATIVE NEGATIVE mg/dL   Urobilinogen, UA 1.0 0.0 - 1.0 mg/dL   Nitrite NEGATIVE NEGATIVE   Leukocytes, UA NEGATIVE NEGATIVE    Comment: MICROSCOPIC NOT DONE ON URINES WITH NEGATIVE PROTEIN, BLOOD, LEUKOCYTES, NITRITE, OR GLUCOSE <1000 mg/dL.   Ct Abdomen Pelvis W Contrast  06/16/2015   CLINICAL DATA:  Acute onset of right lower quadrant abdominal pain and nausea. Initial encounter.  EXAM: CT ABDOMEN AND PELVIS WITH CONTRAST  TECHNIQUE: Multidetector CT imaging of the abdomen and pelvis was performed using the standard protocol following bolus administration of intravenous contrast.  CONTRAST:  198m OMNIPAQUE IOHEXOL 300 MG/ML  SOLN  COMPARISON:  None.  FINDINGS: The visualized lung bases are clear.  The liver and spleen are unremarkable in appearance. The gallbladder is within normal limits. The pancreas and adrenal glands are unremarkable.  The kidneys are unremarkable in appearance. There is no evidence of hydronephrosis. No renal or ureteral stones are seen. No perinephric stranding is appreciated.  No free fluid is identified. The small bowel is unremarkable in appearance. The stomach is within normal limits. No acute vascular abnormalities are seen.  There is a recurrent or persistent abscess at the right hemipelvis, measuring approximately 2.5 x 2.1 x 2.1 cm. Increased surrounding soft tissue density is noted.  Given the extent of the surrounding soft tissue density, this may reflect a chronic abscess. The previously noted appendicolith at the tail of the appendix has progressed into the abscess.  This likely reflects the prior perforated appendicitis. Much of the appendix is unremarkable in appearance. Surrounding soft tissue inflammation is noted, tracking about the adjacent distal ileum.  The colon is unremarkable in appearance.  The bladder is mildly distended and grossly unremarkable. The prostate is enlarged, measuring 5.8 cm in transverse dimension, with nodular enhancing extension at the  base of the bladder. No inguinal lymphadenopathy is seen.  No acute osseous abnormalities are identified.  IMPRESSION: 1. Recurrent or persistent abscess at the right hemipelvis, measuring 2.5 x 2.1 x 2.1 cm. This is smaller than in 2015, but there is increased surrounding soft tissue density. Given the extent of the surrounding soft tissue density, this may reflect chronic abscess, with acute exacerbation. Associated soft tissue inflammation tracks about the adjacent distal ileum. 2. Previously noted appendicolith at the tail of the appendix has progressed into the abscess. This likely reflects the prior perforated appendicitis. Much of the appendix is currently unremarkable in appearance. 3. Enlarged prostate noted, with nodular enhancing extension at the base of the bladder. Would correlate with PSA. These results were called by telephone at the time of interpretation on 06/16/2015 at 1:17 am to Dr. Everlene Balls, who verbally acknowledged these results.   Electronically Signed   By: Garald Balding M.D.   On: 06/16/2015 01:17    Review of Systems  Constitutional: Positive for fever. Negative for chills.  Respiratory: Negative.   Cardiovascular: Negative.   Gastrointestinal: Positive for abdominal pain. Negative for nausea, vomiting, diarrhea, constipation and blood in stool.  Genitourinary: Negative.     Blood pressure  113/63, pulse 60, temperature 98 F (36.7 C), temperature source Oral, resp. rate 20, SpO2 99 %. Physical Exam  General: Alert, well-developed African-American male, in no distress Skin: Warm and dry without rash or infection. HEENT: No palpable masses or thyromegaly. Sclera nonicteric. Pupils equal round and reactive. Oropharynx clear. Lungs: Breath sounds clear and equal without increased work of breathing Cardiovascular: Regular rate and rhythm without murmur. No JVD or edema. Peripheral pulses intact. Abdomen: Nondistended. Soft with localized right lower quadrant tenderness, no guarding or peritoneal signs. No masses palpable. No organomegaly. No palpable hernias. Extremities: No edema or joint swelling or deformity. No chronic venous stasis changes. Neurologic: Alert and fully oriented. Gait normal.  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 There appears to be an appendicolith within the abscess and possibly outside the appendix. The patient does not appear severely ill or toxic. Will admit and start IV antibiotics. I would favor initial nonoperative management but he will need interval appendectomy in 6-8 weeks or possibly appendectomy this hospitalization if he does not improve but likely would require open procedure due to his abscess. This was all discussed with the patient who agrees with the plan.  Dayonna Selbe T 06/16/2015, 2:11 AM

## 2015-06-16 NOTE — ED Notes (Signed)
Patient continues to refuse pain and nausea medication.

## 2015-06-16 NOTE — Progress Notes (Signed)
Patient ID: Billy Colon, male   DOB: Oct 22, 1971, 44 y.o.   MRN: [id number]    Subjective: Feels a little better, somewhat less pain  Objective: Vital signs in last 24 hours: Temp:  [97.6 F (36.4 C)-98.3 F (36.8 C)] 97.6 F (36.4 C) (08/13 0603) Pulse Rate:  [56-66] 56 (08/13 0603) Resp:  [18-20] 18 (08/13 0603) BP: (90-125)/(57-87) 90/57 mmHg (08/13 0603) SpO2:  [99 %-100 %] 100 % (08/13 0603) Weight:  [88 kg (194 lb 0.1 oz)] 88 kg (194 lb 0.1 oz) (08/13 0300) Last BM Date: 06/15/15  Intake/Output from previous day:   Intake/Output this shift: Total I/O In: 50 [IV Piggyback:50] Out: -   General appearance: alert, cooperative and no distress GI: mild right lower quadrant tenderness without guarding  Lab Results:   Recent Labs  06/15/15 2256  WBC 7.6  HGB 14.3  HCT 43.4  PLT 178   BMET  Recent Labs  06/15/15 2256  NA 140  K 4.2  CL 105  CO2 28  GLUCOSE 88  BUN 21*  CREATININE 0.99  CALCIUM 9.4     Studies/Results: Ct Abdomen Pelvis W Contrast  06/16/2015   CLINICAL DATA:  Acute onset of right lower quadrant abdominal pain and nausea. Initial encounter.  EXAM: CT ABDOMEN AND PELVIS WITH CONTRAST  TECHNIQUE: Multidetector CT imaging of the abdomen and pelvis was performed using the standard protocol following bolus administration of intravenous contrast.  CONTRAST:  OMNIPAQUE IOHEXOL 300 MG/ML  SOLN  COMPARISON:  None.  FINDINGS: The visualized lung bases are clear.  The liver and spleen are unremarkable in appearance. The gallbladder is within normal limits. The pancreas and adrenal glands are unremarkable.  The kidneys are unremarkable in appearance. There is no evidence of hydronephrosis. No renal or ureteral stones are seen. No perinephric stranding is appreciated.  No free fluid is identified. The small bowel is unremarkable in appearance. The stomach is within normal limits. No acute vascular abnormalities are seen.  There is a recurrent or  persistent abscess at the right hemipelvis, measuring approximately 2.5 x 2.1 x 2.1 cm. Increased surrounding soft tissue density is noted. Given the extent of the surrounding soft tissue density, this may reflect a chronic abscess. The previously noted appendicolith at the tail of the appendix has progressed into the abscess.  This likely reflects the prior perforated appendicitis. Much of the appendix is unremarkable in appearance. Surrounding soft tissue inflammation is noted, tracking about the adjacent distal ileum.  The colon is unremarkable in appearance.  The bladder is mildly distended and grossly unremarkable. The prostate is enlarged, measuring 5.8 cm in transverse dimension, with nodular enhancing extension at the base of the bladder. No inguinal lymphadenopathy is seen.  No acute osseous abnormalities are identified.  IMPRESSION: 1. Recurrent or persistent abscess at the right hemipelvis, measuring 2.5 x 2.1 x 2.1 cm. This is smaller than in 2015, but there is increased surrounding soft tissue density. Given the extent of the surrounding soft tissue density, this may reflect chronic abscess, with acute exacerbation. Associated soft tissue inflammation tracks about the adjacent distal ileum. 2. Previously noted appendicolith at the tail of the appendix has progressed into the abscess. This likely reflects the prior perforated appendicitis. Much of the appendix is currently unremarkable in appearance. 3. Enlarged prostate noted, with nodular enhancing extension at the base of the bladder. Would correlate with PSA. These results were called by telephone at the time of interpretation on 06/16/2015 at 1:17 am to  Dr. Tomasita Crumble, who verbally acknowledged these results.   Electronically Signed   By: Roanna Raider M.D.   On: 06/16/2015 01:17    Anti-infectives: Anti-infectives    Start     Dose/Rate Route Frequency Ordered Stop   06/16/15 0400  piperacillin-tazobactam (ZOSYN) IVPB 3.375 g     3.375  g 12.5 mL/hr over 240 Minutes Intravenous Every 8 hours 06/16/15 0309     06/16/15 0130  cefOXitin (MEFOXIN) 2 g in dextrose 5 % 50 mL IVPB     2 g 100 mL/hr over 30 Minutes Intravenous  Once 06/16/15 0124 06/16/15 0259      Assessment/Plan: Recurrent appendiceal abscess. Continue antibiotics. Current plan is discharge home on oral antibiotic and interval laparoscopic appendectomy     LOS: 0 days    Chrysta Fulcher T 06/16/2015

## 2015-06-16 NOTE — ED Notes (Signed)
Patient transported to CT 

## 2015-06-17 LAB — CBC
HEMATOCRIT: 39.2 % (ref 39.0–52.0)
Hemoglobin: 12.7 g/dL — ABNORMAL LOW (ref 13.0–17.0)
MCH: 28 pg (ref 26.0–34.0)
MCHC: 32.4 g/dL (ref 30.0–36.0)
MCV: 86.5 fL (ref 78.0–100.0)
PLATELETS: 174 10*3/uL (ref 150–400)
RBC: 4.53 MIL/uL (ref 4.22–5.81)
RDW: 14.9 % (ref 11.5–15.5)
WBC: 5.8 10*3/uL (ref 4.0–10.5)

## 2015-06-17 MED ORDER — ACETAMINOPHEN 325 MG PO TABS: 650.0000 mg | ORAL_TABLET | ORAL | Status: DC | PRN

## 2015-06-17 MED ORDER — HYDROCODONE-ACETAMINOPHEN 5-325 MG PO TABS
1.0000 | ORAL_TABLET | ORAL | Status: DC | PRN
  Administered 2015-06-17 (×2): 2 via ORAL
  Filled 2015-06-17 (×2): qty 2

## 2015-06-17 NOTE — Progress Notes (Signed)
Patient ID: Billy Colon, male   DOB: [birth year], 44 y.o.   MRN: [id number]    Subjective: Feels better. Pain is nearly gone, just sore. No other complaints.  Objective: Vital signs in last 24 hours: Temp:  [97.8 F (36.6 C)-98.5 F (36.9 C)] 97.8 F (36.6 C) (08/14 0600) Pulse Rate:  [47-57] 53 (08/14 0600) Resp:  [18] 18 (08/14 0600) BP: (100-125)/(60-68) 100/60 mmHg (08/14 0600) SpO2:  [100 %] 100 % (08/14 0600) Last BM Date: 06/15/15  Intake/Output from previous day: 08/13 0701 - 08/14 0700 In: 1637 [P.O.:1437; IV Piggyback:200] Out: 400 [Urine:400] Intake/Output this shift:    General appearance: alert, cooperative and no distress GI: normal findings: soft, minimal right lower quadrant tenderness with no guarding  Lab Results:   Recent Labs  06/15/15 2256 06/17/15 0425  WBC 7.6 5.8  HGB 14.3 12.7*  HCT 43.4 39.2  PLT 178 174   BMET  Recent Labs  06/15/15 2256  NA 140  K 4.2  CL 105  CO2 28  GLUCOSE 88  BUN 21*  CREATININE 0.99  CALCIUM 9.4     Studies/Results: Ct Abdomen Pelvis W Contrast  06/16/2015   CLINICAL DATA:  Acute onset of right lower quadrant abdominal pain and nausea. Initial encounter.  EXAM: CT ABDOMEN AND PELVIS WITH CONTRAST  TECHNIQUE: Multidetector CT imaging of the abdomen and pelvis was performed using the standard protocol following bolus administration of intravenous contrast.  CONTRAST:  OMNIPAQUE IOHEXOL 300 MG/ML  SOLN  COMPARISON:  None.  FINDINGS: The visualized lung bases are clear.  The liver and spleen are unremarkable in appearance. The gallbladder is within normal limits. The pancreas and adrenal glands are unremarkable.  The kidneys are unremarkable in appearance. There is no evidence of hydronephrosis. No renal or ureteral stones are seen. No perinephric stranding is appreciated.  No free fluid is identified. The small bowel is unremarkable in appearance. The stomach is within normal limits. No acute vascular  abnormalities are seen.  There is a recurrent or persistent abscess at the right hemipelvis, measuring approximately 2.5 x 2.1 x 2.1 cm. Increased surrounding soft tissue density is noted. Given the extent of the surrounding soft tissue density, this may reflect a chronic abscess. The previously noted appendicolith at the tail of the appendix has progressed into the abscess.  This likely reflects the prior perforated appendicitis. Much of the appendix is unremarkable in appearance. Surrounding soft tissue inflammation is noted, tracking about the adjacent distal ileum.  The colon is unremarkable in appearance.  The bladder is mildly distended and grossly unremarkable. The prostate is enlarged, measuring 5.8 cm in transverse dimension, with nodular enhancing extension at the base of the bladder. No inguinal lymphadenopathy is seen.  No acute osseous abnormalities are identified.  IMPRESSION: 1. Recurrent or persistent abscess at the right hemipelvis, measuring 2.5 x 2.1 x 2.1 cm. This is smaller than in 2015, but there is increased surrounding soft tissue density. Given the extent of the surrounding soft tissue density, this may reflect chronic abscess, with acute exacerbation. Associated soft tissue inflammation tracks about the adjacent distal ileum. 2. Previously noted appendicolith at the tail of the appendix has progressed into the abscess. This likely reflects the prior perforated appendicitis. Much of the appendix is currently unremarkable in appearance. 3. Enlarged prostate noted, with nodular enhancing extension at the base of the bladder. Would correlate with PSA. These results were called by telephone at the time of interpretation on 06/16/2015 at 1:17 am  to Dr. Tomasita Crumble, who verbally acknowledged these results.   Electronically Signed   By: Roanna Raider M.D.   On: 06/16/2015 01:17    Anti-infectives: Anti-infectives    Start     Dose/Rate Route Frequency Ordered Stop   06/16/15 0400   piperacillin-tazobactam (ZOSYN) IVPB 3.375 g     3.375 g 12.5 mL/hr over 240 Minutes Intravenous Every 8 hours 06/16/15 0309     06/16/15 0130  cefOXitin (MEFOXIN) 2 g in dextrose 5 % 50 mL IVPB     2 g 100 mL/hr over 30 Minutes Intravenous  Once 06/16/15 0124 06/16/15 0259      Assessment/Plan: Recurrent appendiceal abscess. Responding very well to IV antibiotics clinically. Advanced to regular diet. Continue antibiotics today. Likely could go home in the next couple of days on oral antibiotics and follow-up CT scan as an outpatient. Will then need interval appendectomy.     LOS: 1 day    Markevius Trombetta T 06/17/2015

## 2015-06-17 NOTE — Progress Notes (Signed)
Pt stated last night he thought the IV Zosyn was making him nauseous.  Now he remembers he became after sick on his stomach after he received Oxycodone for pain yesterday. He will need another PO pain med since he will no longer take oxycodone.

## 2015-06-17 NOTE — Progress Notes (Signed)
Pt refusing his Lovenox. I educated pt as to why it was prescribed and how it prevents blood clots. He stated that he moves a lot and he doesen't feel he need it.

## 2015-06-18 MED ORDER — HYDROCODONE-ACETAMINOPHEN 5-325 MG PO TABS: 1.0000 | ORAL_TABLET | Freq: Four times a day (QID) | ORAL | Status: AC | PRN

## 2015-06-18 MED ORDER — AMOXICILLIN-POT CLAVULANATE 875-125 MG PO TABS: 1.0000 | ORAL_TABLET | Freq: Two times a day (BID) | ORAL | Status: AC

## 2015-06-18 NOTE — Discharge Instructions (Signed)
Please complete all of your antibiotics. Be sure to take probiotics.  Call surgery if you develop fever, chills, worsening pain.  You will have a CT scan before you follow up with Dr. Maisie Fus

## 2015-06-18 NOTE — Discharge Summary (Signed)
Physician Discharge Summary  Billy Colon WJX:[id number] DOB: 1971/01/18 DOA: 06/15/2015  PCP: No PCP Per Patient  Consultation: none  Admit date: 06/15/2015 Discharge date: 06/18/2015  Recommendations for Outpatient Follow-up:   Follow-up Information    Follow up with Vanita Panda., MD In 2 weeks.   Specialty:  General Surgery   Contact information:   [address] [place] [address] [phone]      Discharge Diagnoses:  1. Recurrent appendiceal abscess   Surgical Procedure: none  Discharge Condition: stable Disposition: home  Diet recommendation: regular  Filed Weights   06/16/15 0300  Weight: 88 kg (194 lb 0.1 oz)     Filed Vitals:   06/18/15 0635  BP: 101/61  Pulse: 54  Temp: 98.8 F (37.1 C)  Resp: 18     Hospital Course:  Billy Colon was initially hospitalized in October of 2015 for focally ruptured appendicitis.  He was treated non operatively and followed up with Dr. Maisie Fus as an outpatient who recommended an appendectomy, but the patient was doing well and decided not to proceed with the operation.  He presented to [facility] with RLW abdominal pain.  A CT scan showed recurrent appendiceal abscess.  He was admitted for IV antibiotics.  He improved and diet was advanced.  Remained afebrile and without leukocytosis.  On HD#3 the patient was tolerating a diet, having BMs, afebrile, had minimal pain and therefore felt stable for discharge.  He will follow up with Dr. Maisie Fus for consideration for interval appendectomy.  A CT will be arranged prior to his vitis.  Warning signs that warrant immediate attention were discussed.  He was encouraged to complete Augmentin course x14 days.  Medication risks, benefits and therapeutic alternatives were reviewed with the patient.  He verbalizes understanding.   Physical Exam: General appearance: alert and oriented. Calm and cooperative No acute distress. VSS. Afebrile.  GI: soft round and nontender. +BS x4  quadrants. No organomegaly, hernias or masses.     Discharge Instructions     Medication List    STOP taking these medications        oxyCODONE-acetaminophen 5-325 MG per tablet  Commonly known as:  PERCOCET/ROXICET      TAKE these medications        acetaminophen 325 MG tablet  Commonly known as:  TYLENOL  Take 2 tablets (650 mg total) by mouth every 6 (six) hours as needed for mild pain (or Temp > 100).     amoxicillin-clavulanate 875-125 MG per tablet  Commonly known as:  AUGMENTIN  Take 1 tablet by mouth every 12 (twelve) hours.     HYDROcodone-acetaminophen 5-325 MG per tablet  Commonly known as:  NORCO/VICODIN  Take 1 tablet by mouth every 6 (six) hours as needed for moderate pain.     naproxen sodium 220 MG tablet  Commonly known as:  ANAPROX  You can take this or plain Tylenol (acetaminophen) as needed for pain.     OIL OF OREGANO PO  Take 1 capsule by mouth daily.     saccharomyces boulardii 250 MG capsule  Commonly known as:  FLORASTOR  You can get any Probiotic over the counter and any drug store.  I would use it till you are completely off the antibiotics for a week or more.           Follow-up Information    Follow up with Vanita Panda., MD In 2 weeks.   Specialty:  General Surgery  Contact information:   [address] [place] [address] [phone]        The results of significant diagnostics from this hospitalization (including imaging, microbiology, ancillary and laboratory) are listed below for reference.    Significant Diagnostic Studies: Ct Abdomen Pelvis W Contrast  06/16/2015   CLINICAL DATA:  Acute onset of right lower quadrant abdominal pain and nausea. Initial encounter.  EXAM: CT ABDOMEN AND PELVIS WITH CONTRAST  TECHNIQUE: Multidetector CT imaging of the abdomen and pelvis was performed using the standard protocol following bolus administration of intravenous contrast.  CONTRAST:  OMNIPAQUE IOHEXOL 300  MG/ML  SOLN  COMPARISON:  None.  FINDINGS: The visualized lung bases are clear.  The liver and spleen are unremarkable in appearance. The gallbladder is within normal limits. The pancreas and adrenal glands are unremarkable.  The kidneys are unremarkable in appearance. There is no evidence of hydronephrosis. No renal or ureteral stones are seen. No perinephric stranding is appreciated.  No free fluid is identified. The small bowel is unremarkable in appearance. The stomach is within normal limits. No acute vascular abnormalities are seen.  There is a recurrent or persistent abscess at the right hemipelvis, measuring approximately 2.5 x 2.1 x 2.1 cm. Increased surrounding soft tissue density is noted. Given the extent of the surrounding soft tissue density, this may reflect a chronic abscess. The previously noted appendicolith at the tail of the appendix has progressed into the abscess.  This likely reflects the prior perforated appendicitis. Much of the appendix is unremarkable in appearance. Surrounding soft tissue inflammation is noted, tracking about the adjacent distal ileum.  The colon is unremarkable in appearance.  The bladder is mildly distended and grossly unremarkable. The prostate is enlarged, measuring 5.8 cm in transverse dimension, with nodular enhancing extension at the base of the bladder. No inguinal lymphadenopathy is seen.  No acute osseous abnormalities are identified.  IMPRESSION: 1. Recurrent or persistent abscess at the right hemipelvis, measuring 2.5 x 2.1 x 2.1 cm. This is smaller than in 2015, but there is increased surrounding soft tissue density. Given the extent of the surrounding soft tissue density, this may reflect chronic abscess, with acute exacerbation. Associated soft tissue inflammation tracks about the adjacent distal ileum. 2. Previously noted appendicolith at the tail of the appendix has progressed into the abscess. This likely reflects the prior perforated appendicitis. Much  of the appendix is currently unremarkable in appearance. 3. Enlarged prostate noted, with nodular enhancing extension at the base of the bladder. Would correlate with PSA. These results were called by telephone at the time of interpretation on 06/16/2015 at 1:17 am to Dr. Tomasita Crumble, who verbally acknowledged these results.   Electronically Signed   By: Roanna Raider M.D.   On: 06/16/2015 01:17    Microbiology: No results found for this or any previous visit (from the past 240 hour(s)).   Labs: Basic Metabolic Panel:  Recent Labs Lab 06/15/15 2256  NA 140  K 4.2  CL 105  CO2 28  GLUCOSE 88  BUN 21*  CREATININE 0.99  CALCIUM 9.4   Liver Function Tests:  Recent Labs Lab 06/15/15 2256  AST 34  ALT 30  ALKPHOS 83  BILITOT 1.1  PROT 8.2*  ALBUMIN 4.2    Recent Labs Lab 06/15/15 2256  LIPASE 22   No results for input(s): AMMONIA in the last 168 hours. CBC:  Recent Labs Lab 06/15/15 2256 06/17/15 0425  WBC 7.6 5.8  HGB 14.3  12.7*  HCT 43.4 39.2  MCV 85.1 86.5  PLT 178 174   Cardiac Enzymes: No results for input(s): CKTOTAL, CKMB, CKMBINDEX, TROPONINI in the last 168 hours. BNP: BNP (last 3 results) No results for input(s): BNP in the last 8760 hours.  ProBNP (last 3 results) No results for input(s): PROBNP in the last 8760 hours.  CBG: No results for input(s): GLUCAP in the last 168 hours.  Active Problems:   Appendiceal abscess   Time coordinating discharge: <30 mins  Signed:  Emina Riebock, ANP-BC  Agree with above.  Ovidio Kin, MD, [facility] Pager: [phone] Office phone:  [phone]

## 2015-06-25 ENCOUNTER — Other Ambulatory Visit: Payer: Self-pay | Admitting: General Surgery

## 2015-06-25 DIAGNOSIS — K388 Other specified diseases of appendix: Secondary | ICD-10-CM

## 2015-07-04 ENCOUNTER — Other Ambulatory Visit: Payer: Self-pay | Admitting: General Surgery

## 2015-07-04 DIAGNOSIS — K388 Other specified diseases of appendix: Secondary | ICD-10-CM

## 2015-07-10 ENCOUNTER — Inpatient Hospital Stay: Admission: RE | Admit: 2015-07-10 | Payer: Self-pay | Source: Ambulatory Visit

## 2015-07-25 ENCOUNTER — Other Ambulatory Visit: Payer: Self-pay

## 2015-09-07 IMAGING — CT CT ABD-PELV W/ CM
1 of 3 series · 14 of 32 positions shown, 19 images · IV contrast (omnipaque)
Comparison: None.

CLINICAL DATA: Low abdominal pain

EXAM:
CT ABDOMEN AND PELVIS WITH CONTRAST
TECHNIQUE: Multidetector CT imaging of the abdomen and pelvis was performed
using the standard protocol following bolus administration of
intravenous contrast.
CONTRAST:  50mL OMNIPAQUE IOHEXOL 300 MG/ML SOLN, 100mL OMNIPAQUE
IOHEXOL 300 MG/ML SOLN

[Series 2: abd/pel with · axial · 0.74mm/px · z∈[+748,+1168]mm · 14 of 96 slices shown, 19 images]
[im 6/96  soft-tissue]
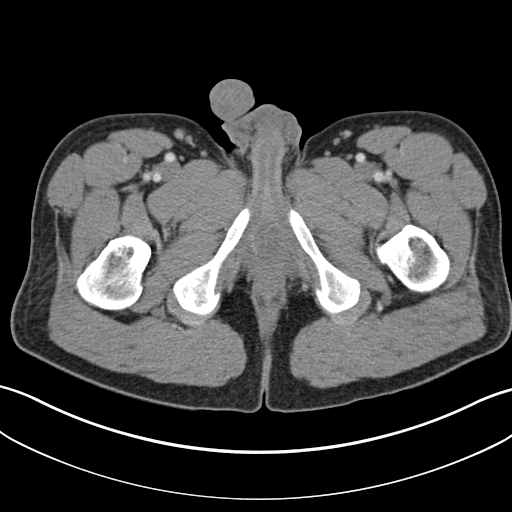
[im 6/96  bone]
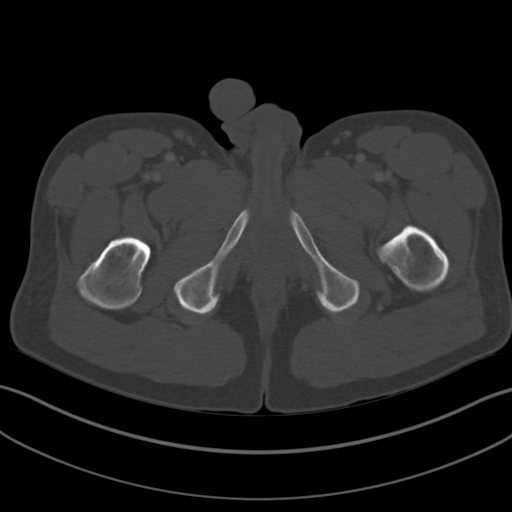
[im 11/96  soft-tissue]
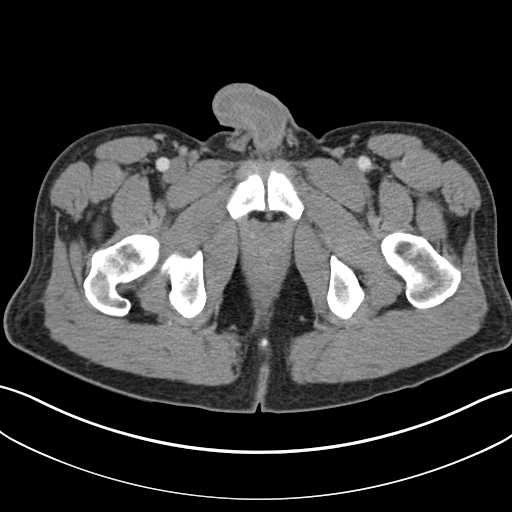
[im 22/96  soft-tissue]
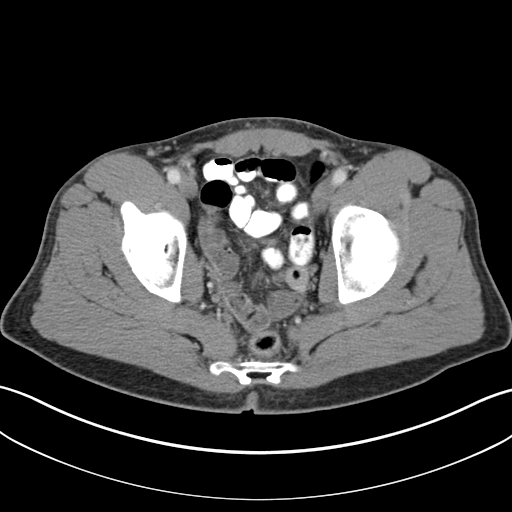
[im 27/96  soft-tissue]
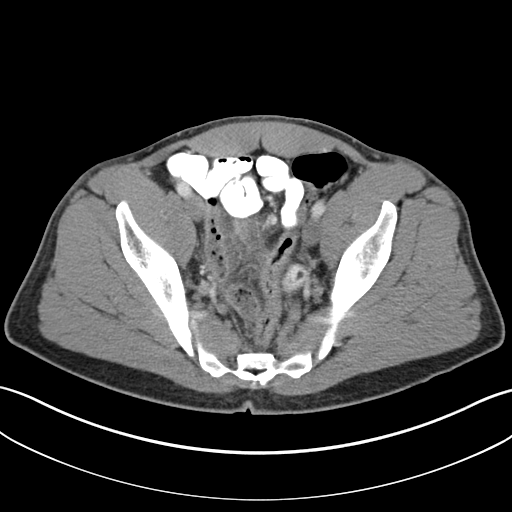
[im 32/96  soft-tissue]
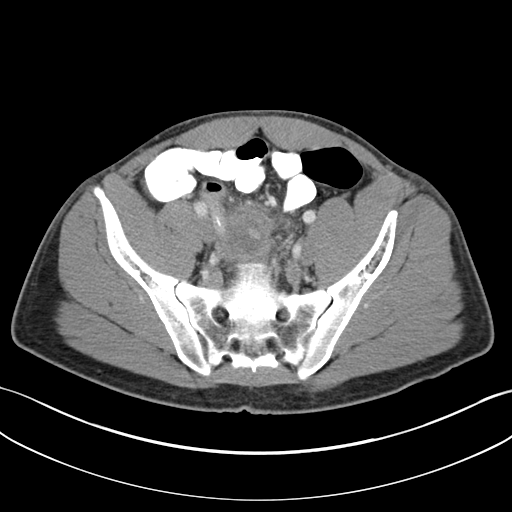
[im 43/96  soft-tissue]
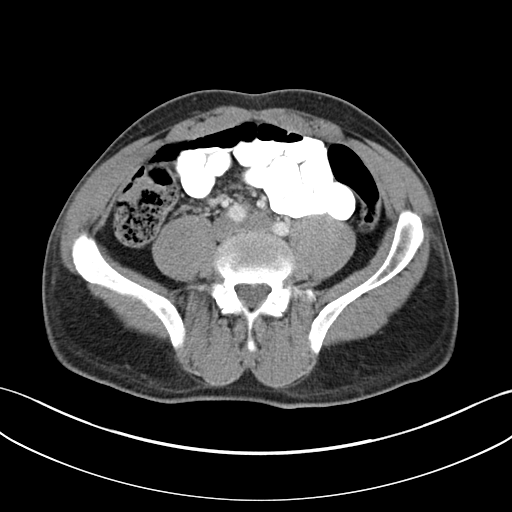
[im 48/96  soft-tissue]
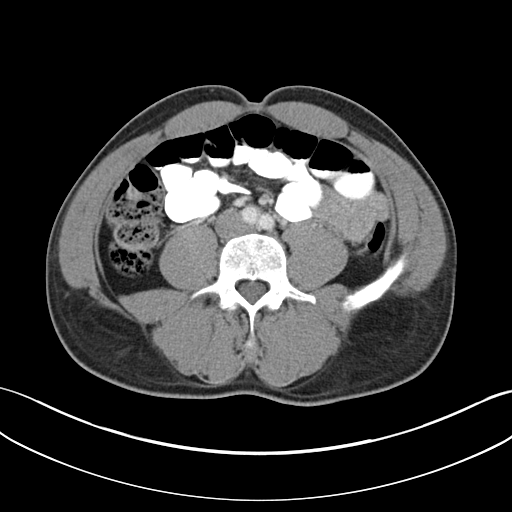
[im 53/96  soft-tissue]
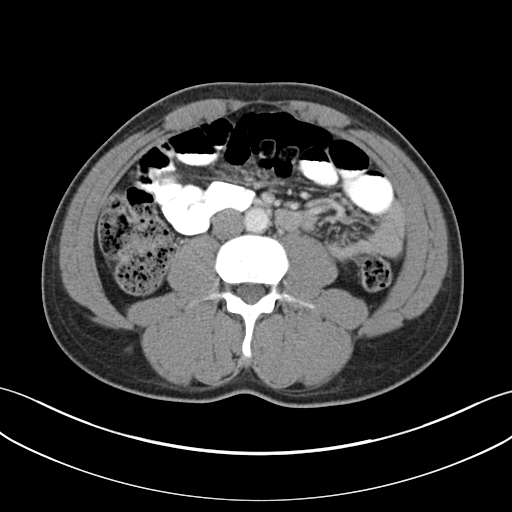
[im 64/96  soft-tissue]
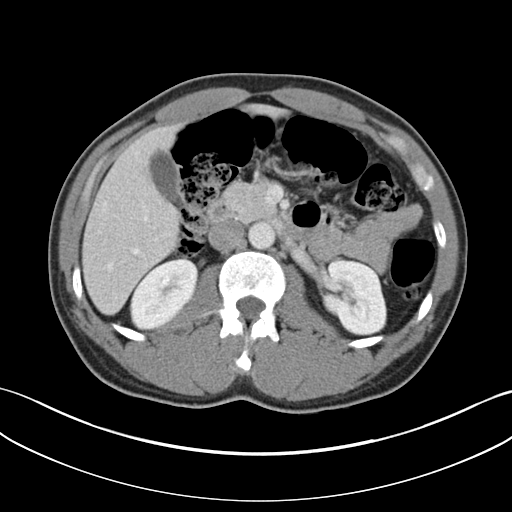
[im 64/96  bone]
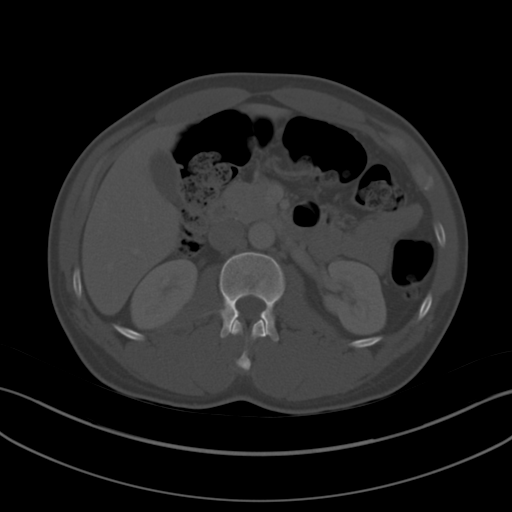
[im 69/96  soft-tissue]
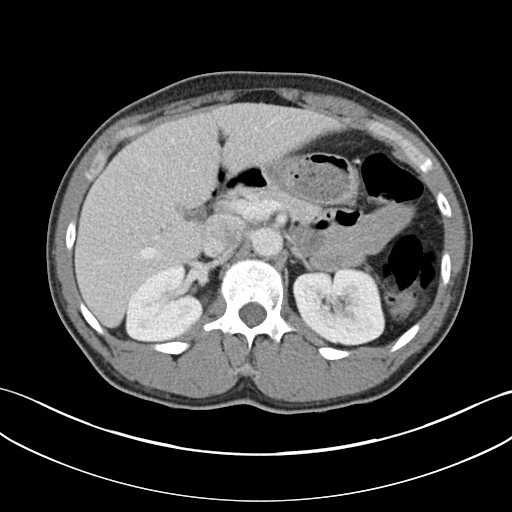
[im 74/96  soft-tissue]
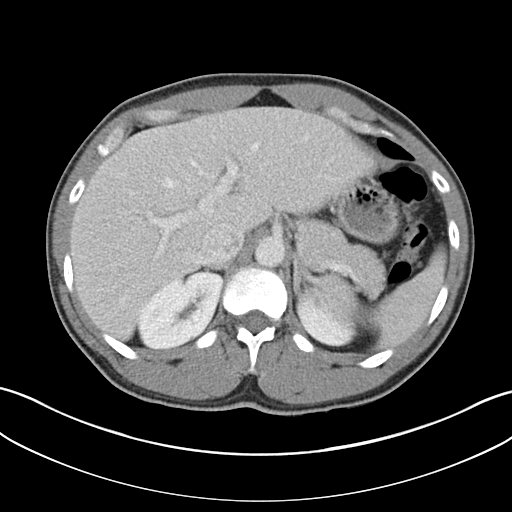
[im 74/96  lung]
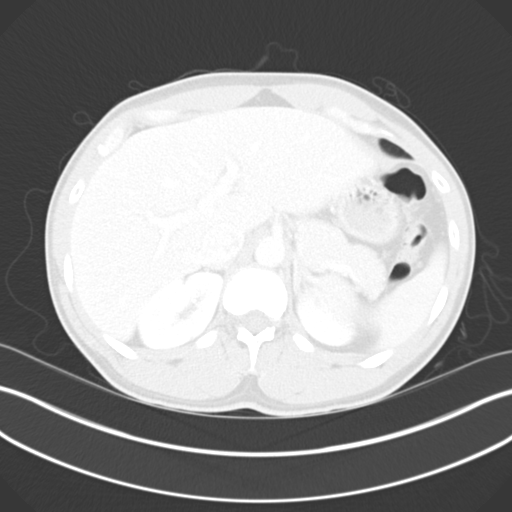
[im 80/96  lung]
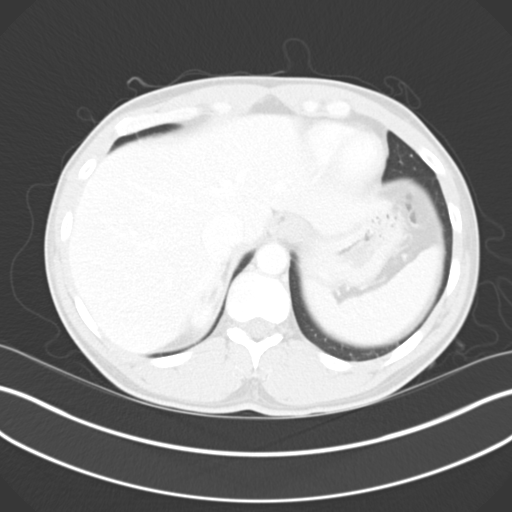
[im 85/96  soft-tissue]
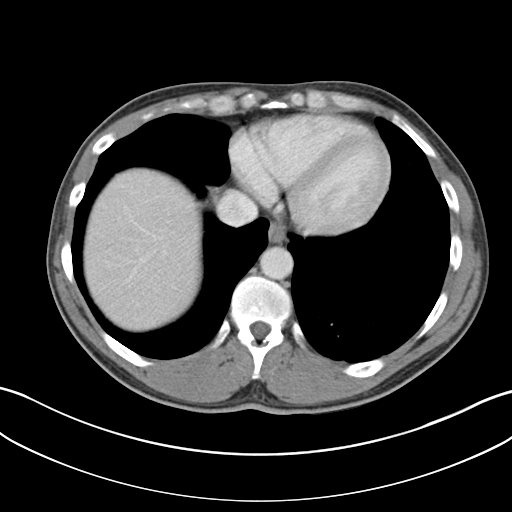
[im 85/96  lung]
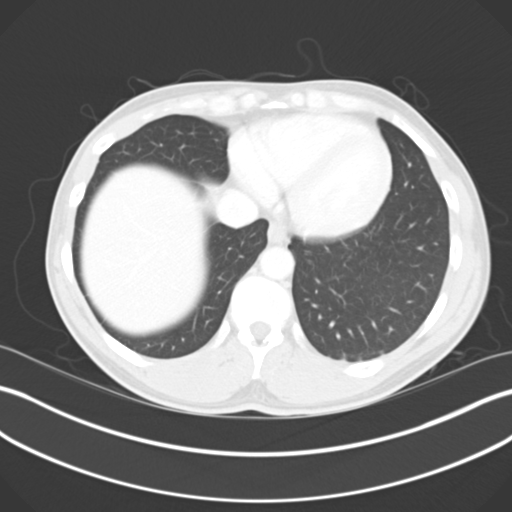
[im 90/96  soft-tissue]
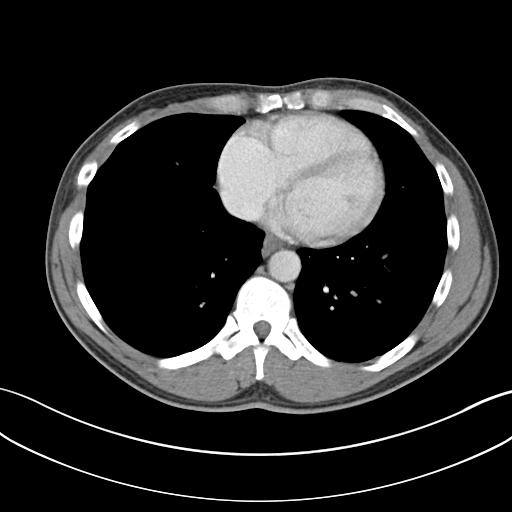
[im 90/96  lung]
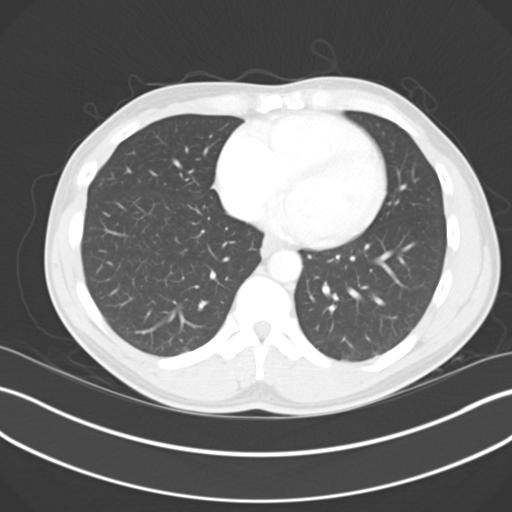

[14 of 32 positions shown; findings below may reference images not displayed]

FINDINGS: The lung bases are free of acute infiltrate or sizable effusion. No
parenchymal nodules are seen.

The liver, gallbladder, spleen, adrenal glands and pancreas are all
normal in their CT appearance. The kidneys demonstrate a normal
enhancement pattern bilaterally. No renal calculi or obstructive
changes are seen.

The appendix is well visualized and appears within normal limits
centrally at its junction with the ascending colon. More
peripherally there are calcifications within and the appendix
terminates in the region of a multiloculated fluid collection which
measures approximately 4.4 x 3.7 cm. Although this may simply
represent an abnormally dilated distal appendix, the possibility of
localized rupture of appendicitis with a small multiloculated
abscess would deserve consideration. This is not amenable to
percutaneous drainage as no clear window is noted.

The bladder is well distended. No free fluid is noted within the
pelvis. The osseous structures show no acute abnormality.
IMPRESSION: Changes consistent with appendicitis either within abnormally
dilated appendiceal tip or a localized rupture with contained
multiloculated abscess. It is not amenable to percutaneous
intervention due to its position deep within this pelvis overlying
the lumbosacral junction. Surgical consultation is recommended.

## 2019-06-11 ENCOUNTER — Other Ambulatory Visit: Payer: Self-pay

## 2019-06-11 ENCOUNTER — Emergency Department (HOSPITAL_COMMUNITY)
Admission: EM | Admit: 2019-06-11 | Discharge: 2019-06-12 | Disposition: A | Discharge status: Home / Self Care | Payer: Self-pay | Attending: Emergency Medicine | Admitting: Emergency Medicine

## 2019-06-11 ENCOUNTER — Encounter (HOSPITAL_COMMUNITY): Payer: Self-pay | Admitting: Emergency Medicine

## 2019-06-11 DIAGNOSIS — S01112A Laceration without foreign body of left eyelid and periocular area, initial encounter: Secondary | ICD-10-CM | POA: Insufficient documentation

## 2019-06-11 DIAGNOSIS — Y998 Other external cause status: Secondary | ICD-10-CM | POA: Insufficient documentation

## 2019-06-11 DIAGNOSIS — Y929 Unspecified place or not applicable: Secondary | ICD-10-CM | POA: Insufficient documentation

## 2019-06-11 DIAGNOSIS — I10 Essential (primary) hypertension: Secondary | ICD-10-CM | POA: Insufficient documentation

## 2019-06-11 DIAGNOSIS — Z79899 Other long term (current) drug therapy: Secondary | ICD-10-CM | POA: Insufficient documentation

## 2019-06-11 DIAGNOSIS — S0181XA Laceration without foreign body of other part of head, initial encounter: Secondary | ICD-10-CM

## 2019-06-11 DIAGNOSIS — W208XXA Other cause of strike by thrown, projected or falling object, initial encounter: Secondary | ICD-10-CM | POA: Insufficient documentation

## 2019-06-11 DIAGNOSIS — Y9389 Activity, other specified: Secondary | ICD-10-CM | POA: Insufficient documentation

## 2019-06-11 NOTE — ED Triage Notes (Signed)
Pt reports he was doing yard work and a tree limb hit his face. Pt has a small laceration above L eyebrow, small amount of bleeding noted. Pt unsure of last tetanus.

## 2019-06-11 NOTE — Discharge Instructions (Addendum)
Return here as needed.  The glue will come off on its own do not pick or scrub over it.

## 2019-06-14 NOTE — ED Provider Notes (Addendum)
[facility] Provider Note   CSN: [id number] Arrival date & time: 06/11/19  1243     History   Chief Complaint Chief Complaint  Patient presents with  . Facial Laceration    HPI Billy Colon is a 48 y.o. male.     HPI Patient presents to the emergency department with a small laceration above the left eyebrow after getting hit with a branch from a tree.  Patient states he was cutting the.  When the branch came down and hit him in the face and left forehead.  Several abrasions to the right side of his forehead.  Patient denies any loss consciousness or other injuries.  Patient states he has no blurred vision or numbness or headache.  Bleeding was controlled with direct pressure of the wound. Past Medical History:  Diagnosis Date  . Medical history non-contributory     Patient Active Problem List   Diagnosis Date Noted  . Appendiceal abscess 06/16/2015  . Ruptured appendicitis 08/03/2014  . Appendicitis 08/02/2014  . HYPERTENSION 06/03/2007  . ALLERGIC RHINITIS 06/03/2007  . HEADACHE 06/03/2007    Past Surgical History:  Procedure Laterality Date  . NO PAST SURGERIES          Home Medications    Prior to Admission medications   Medication Sig Start Date End Date Taking? Authorizing Provider  acetaminophen (TYLENOL) 325 MG tablet Take 2 tablets (650 mg total) by mouth every 6 (six) hours as needed for mild pain (or Temp > 100). Patient not taking: Reported on 06/15/2015 08/07/14   Earnstine Regal, PA-C  HYDROcodone-acetaminophen (NORCO/VICODIN) 5-325 MG per tablet Take 1 tablet by mouth every 6 (six) hours as needed for moderate pain. 06/18/15   Riebock, Estill Bakes, NP  naproxen sodium (ANAPROX) 220 MG tablet You can take this or plain Tylenol (acetaminophen) as needed for pain. Patient not taking: Reported on 06/15/2015 08/07/14   Earnstine Regal, PA-C  OIL OF OREGANO PO Take 1 capsule by mouth daily.    [provider]   saccharomyces boulardii (FLORASTOR) 250 MG capsule You can get any Probiotic over the counter and any drug store.  I would use it till you are completely off the antibiotics for a week or more. Patient not taking: Reported on 06/15/2015 08/07/14   Earnstine Regal, PA-C    Family History History reviewed. No pertinent family history.  Social History Social History   Tobacco Use  . Smoking status: Never Smoker  . Smokeless tobacco: Never Used  Substance Use Topics  . Alcohol use: Yes    Comment: Occasional  . Drug use: No     Allergies   Pollen extract   Review of Systems Review of Systems All other systems negative except as documented in the HPI. All pertinent positives and negatives as reviewed in the HPI.  Physical Exam Updated Vital Signs BP (!) 144/94 (BP Location: Right Arm)   Pulse 73   Temp 98.4 F (36.9 C) (Oral)   Resp 12   SpO2 99%   Physical Exam Vitals signs and nursing note reviewed.  Constitutional:      General: He is not in acute distress.    Appearance: He is well-developed.  HENT:     Head: Normocephalic.   Eyes:     Pupils: Pupils are equal, round, and reactive to light.  Pulmonary:     Effort: Pulmonary effort is normal.  Skin:    General: Skin is warm and dry.  Neurological:  Mental Status: He is alert and oriented to person, place, and time.      ED Treatments / Results  Labs (all labs ordered are listed, but only abnormal results are displayed) Labs Reviewed - No data to display  EKG None  Radiology No results found.  Procedures Procedures (including critical care time)  Medications Ordered in ED Medications - No data to display   Initial Impression / Assessment and Plan / ED Course  I have reviewed the triage vital signs and the nursing notes.  Pertinent labs & imaging results that were available during my care of the patient were reviewed by me and considered in my medical decision making (see chart for  details).        Dermabond was used to close the superficial laceration above the left eyebrow.  Patient advised to keep the Dermabond in place and do not pick at the area or scrub over it.  Told to return here as needed. 3.5 cm laceration that is fairly superficial  Final Clinical Impressions(s) / ED Diagnoses   Final diagnoses:  Facial laceration, initial encounter    ED Discharge Orders    None       Kyra MangesLawyer, Noam Franzen, PA-C 06/14/19 2350    Milagros Lollykstra, Richard S, MD 06/15/19 1412    Charlestine NightLawyer, Ysabella Babiarz, PA-C 07/15/19 1006    Milagros Lollykstra, Richard S, MD 07/18/19 [phone]

## 2019-10-06 ENCOUNTER — Other Ambulatory Visit: Payer: Self-pay

## 2019-10-06 DIAGNOSIS — Z20822 Contact with and (suspected) exposure to covid-19: Secondary | ICD-10-CM

## 2019-10-10 LAB — NOVEL CORONAVIRUS, NAA: SARS-CoV-2, NAA: DETECTED — AB

## 2023-12-03 ENCOUNTER — Other Ambulatory Visit: Payer: Self-pay

## 2023-12-03 ENCOUNTER — Emergency Department (HOSPITAL_COMMUNITY)
Admission: EM | Admit: 2023-12-03 | Discharge: 2023-12-03 | Disposition: A | Discharge status: Home / Self Care | Payer: Self-pay | Attending: Emergency Medicine | Admitting: Emergency Medicine

## 2023-12-03 ENCOUNTER — Encounter (HOSPITAL_COMMUNITY): Payer: Self-pay

## 2023-12-03 DIAGNOSIS — L0291 Cutaneous abscess, unspecified: Secondary | ICD-10-CM

## 2023-12-03 DIAGNOSIS — L02212 Cutaneous abscess of back [any part, except buttock]: Secondary | ICD-10-CM | POA: Insufficient documentation

## 2023-12-03 MED ORDER — LIDOCAINE-EPINEPHRINE (PF) 2 %-1:200000 IJ SOLN
10.0000 mL | Freq: Once | INTRAMUSCULAR | Status: AC
  Administered 2023-12-03: 10 mL
  Filled 2023-12-03: qty 20

## 2023-12-03 NOTE — ED Provider Notes (Signed)
[facility] AT [facility] Provider Note   CSN: [id number] Arrival date & time: 12/03/23  0815     History  Chief Complaint  Patient presents with   Abscess    Billy Colon is a 53 y.o. male.  Billy Colon is a 53 y.o. male is otherwise healthy, presents to the ED for evaluation of a bump on his right upper back.  He reports that he had a small pimple in this area on his back for about a year that was never painful or bothered him.  Over the past 3 to 4 days it has become increasingly swollen, red and painful.  He has never had similar issues before.  No drainage from the area.  No fevers.  No other aggravating or alleviating factors.  The history is provided by the patient and the spouse.  Abscess Associated symptoms: no fever        Home Medications Prior to Admission medications   Medication Sig Start Date End Date Taking? Authorizing Provider  acetaminophen (TYLENOL) 325 MG tablet Take 2 tablets (650 mg total) by mouth every 6 (six) hours as needed for mild pain (or Temp > 100). Patient not taking: Reported on 06/15/2015 08/07/14   Sherrie George, PA-C  HYDROcodone-acetaminophen (NORCO/VICODIN) 5-325 MG per tablet Take 1 tablet by mouth every 6 (six) hours as needed for moderate pain. 06/18/15   Riebock, Anette Riedel, NP  naproxen sodium (ANAPROX) 220 MG tablet You can take this or plain Tylenol (acetaminophen) as needed for pain. Patient not taking: Reported on 06/15/2015 08/07/14   Sherrie George, PA-C  OIL OF OREGANO PO Take 1 capsule by mouth daily.    [provider]  saccharomyces boulardii (FLORASTOR) 250 MG capsule You can get any Probiotic over the counter and any drug store.  I would use it till you are completely off the antibiotics for a week or more. Patient not taking: Reported on 06/15/2015 08/07/14   Sherrie George, PA-C      Allergies    Pollen extract    Review of Systems   Review of Systems  Constitutional:   Negative for chills and fever.    Physical Exam Updated Vital Signs BP 132/80   Pulse 80   Temp 97.9 F (36.6 C) (Oral)   Resp 18   Ht 6\' 1"  (1.854 m)   Wt 97.5 kg   SpO2 100%   BMI 28.37 kg/m  Physical Exam Vitals and nursing note reviewed.  Constitutional:      General: He is not in acute distress.    Appearance: Normal appearance. He is well-developed. He is not ill-appearing or diaphoretic.  HENT:     Head: Normocephalic and atraumatic.  Eyes:     General:        Right eye: No discharge.        Left eye: No discharge.  Pulmonary:     Effort: Pulmonary effort is normal. No respiratory distress.  Skin:    Comments: 2 x 2 cm area of fluctuance with overlying redness over the right upper back, no expressible drainage.  No crepitus.  Neurological:     Mental Status: He is alert and oriented to person, place, and time.     Coordination: Coordination normal.  Psychiatric:        Mood and Affect: Mood normal.        Behavior: Behavior normal.     ED Results / Procedures / Treatments   Labs (all  labs ordered are listed, but only abnormal results are displayed) Labs Reviewed - No data to display  EKG None  Radiology No results found.  Procedures .Incision and Drainage  Date/Time: 12/03/2023 11:50 AM  Performed by: Rosezella Rumpf, PA-C Authorized by: Rosezella Rumpf, PA-C   Consent:    Consent obtained:  Verbal   Consent given by:  Patient   Risks, benefits, and alternatives were discussed: yes     Risks discussed:  Bleeding   Alternatives discussed:  No treatment Universal protocol:    Procedure explained and questions answered to patient or proxy's satisfaction: yes     Patient identity confirmed:  Verbally with patient Location:    Type:  Abscess   Size:  2 x 2cm   Location:  Trunk   Trunk location:  Back Pre-procedure details:    Skin preparation:  Chlorhexidine Sedation:    Sedation type:  None Anesthesia:    Anesthesia method:  Local  infiltration   Local anesthetic:  Lidocaine 2% WITH epi Procedure type:    Complexity:  Simple Procedure details:    Ultrasound guidance: no     Needle aspiration: no     Incision types:  Single straight   Incision depth:  Dermal   Wound management:  Probed and deloculated   Drainage:  Bloody and purulent   Drainage amount:  Copious   Packing materials:  None Post-procedure details:    Procedure completion:  Tolerated well, no immediate complications .Ultrasound ED Soft Tissue  Date/Time: 12/03/2023 11:51 AM  Performed by: Rosezella Rumpf, PA-C Authorized by: Rosezella Rumpf, PA-C   Procedure details:    Indications: localization of abscess     Transverse view:  Visualized   Longitudinal view:  Visualized   Images: archived   Location:    Location: upper back     Side:  Right Findings:     abscess present    no cellulitis present     Medications Ordered in ED Medications - No data to display  ED Course/ Medical Decision Making/ A&P                                 Medical Decision Making  Patient presents to the ED with abscess amenable to I&D based on exam confirmed on bedside US. Procedure per note above. Abscess cavity did not seem large enough to warrant packing/drain placement.  Recommended application of warm compresses/soaks/flushing.  I discussed treatment plan, need for follow-up, and return precautions with the patient. Provided opportunity for questions, patient confirmed understanding and is in agreement with plan.          Final Clinical Impression(s) / ED Diagnoses Final diagnoses:  Abscess    Rx / DC Orders ED Discharge Orders     None         Velda Shell 12/03/23 1154    Melene Plan, DO 12/03/23 1352

## 2023-12-03 NOTE — Discharge Instructions (Signed)
You were seen in the emergency department for a skin abscess- please see the attached handout for further information regarding this diagnoses. This area was incised and drained to help release the bacteria. We would like you to apply warm compresses and warm flushes to this area 4-5 times per day to help facilitate further draining as needed.   You can use ibuprofen and tylenol as needed for pain.    We would like you to have this area rechecked within 48 hours if it is not improving- please return to the ER , go to an urgent care, or see your primary care provider. Return to the ER sooner for new or worsening symptoms including, but not limited to increased pain, spreading redness, fevers, inability to keep fluids down, or any other concerns that you may have.

## 2023-12-03 NOTE — ED Triage Notes (Signed)
Patient said he has had a pimple on his right posterior shoulder area for over a year. But over the last week it has felt more sore, red and swelling now. Put heat on it and said it hurts even more.
